# Patient Record
Sex: Male | Born: 1975 | Race: White | Hispanic: No | Marital: Married | Smoking: Never smoker
Health system: Southern US, Community
[De-identification: ages and names within clinical notes are randomized; demographics above are authoritative.]

## PROBLEM LIST (undated history)

## (undated) DIAGNOSIS — E7800 Pure hypercholesterolemia, unspecified: Secondary | ICD-10-CM

## (undated) HISTORY — PX: BACK SURGERY: SHX140

## (undated) HISTORY — PX: ANTERIOR CRUCIATE LIGAMENT REPAIR: SHX115

## (undated) HISTORY — DX: Pure hypercholesterolemia, unspecified: E78.00

---

## 2004-12-16 ENCOUNTER — Encounter: Admission: RE | Admit: 2004-12-16 | Discharge: 2004-12-16 | Payer: Self-pay | Admitting: Orthopedic Surgery

## 2006-03-26 ENCOUNTER — Other Ambulatory Visit: Admission: RE | Admit: 2006-03-26 | Discharge: 2006-03-26 | Payer: Self-pay | Admitting: Urology

## 2011-02-07 ENCOUNTER — Encounter: Payer: Self-pay | Admitting: *Deleted

## 2011-02-07 ENCOUNTER — Emergency Department (HOSPITAL_COMMUNITY): Admission: EM | Admit: 2011-02-07 | Payer: Self-pay | Source: Home / Self Care

## 2011-02-07 NOTE — ED Notes (Signed)
Pt in c/o SI, self inflicted lacerations to bilateral arms and neck, [employer] to bedside but pt is voluntary, pt states he wants help, bleeding controlled

## 2012-03-01 ENCOUNTER — Other Ambulatory Visit: Payer: Self-pay | Admitting: Sports Medicine

## 2012-03-01 DIAGNOSIS — M545 Low back pain: Secondary | ICD-10-CM

## 2012-03-04 ENCOUNTER — Ambulatory Visit
Admission: RE | Admit: 2012-03-04 | Discharge: 2012-03-04 | Disposition: A | Discharge status: Home / Self Care | Payer: Self-pay | Source: Ambulatory Visit | Attending: Sports Medicine | Admitting: Sports Medicine

## 2012-03-04 DIAGNOSIS — M545 Low back pain: Secondary | ICD-10-CM

## 2012-03-05 ENCOUNTER — Other Ambulatory Visit: Payer: Self-pay

## 2012-03-12 ENCOUNTER — Other Ambulatory Visit: Payer: Self-pay | Admitting: Neurosurgery

## 2012-03-12 DIAGNOSIS — M5126 Other intervertebral disc displacement, lumbar region: Secondary | ICD-10-CM

## 2012-03-16 ENCOUNTER — Other Ambulatory Visit: Payer: Self-pay

## 2012-03-18 ENCOUNTER — Ambulatory Visit
Admission: RE | Admit: 2012-03-18 | Discharge: 2012-03-18 | Disposition: A | Discharge status: Home / Self Care | Payer: BC Managed Care – PPO | Source: Ambulatory Visit | Attending: Neurosurgery | Admitting: Neurosurgery

## 2012-03-18 VITALS — BP 152/92 | HR 90

## 2012-03-18 DIAGNOSIS — M5126 Other intervertebral disc displacement, lumbar region: Secondary | ICD-10-CM

## 2012-03-18 MED ORDER — METHYLPREDNISOLONE ACETATE 40 MG/ML INJ SUSP (RADIOLOG
120.0000 mg | Freq: Once | INTRAMUSCULAR | Status: AC
  Administered 2012-03-18: 120 mg via EPIDURAL

## 2012-03-18 MED ORDER — IOHEXOL 180 MG/ML  SOLN
1.0000 mL | Freq: Once | INTRAMUSCULAR | Status: AC | PRN
  Administered 2012-03-18: 1 mL via EPIDURAL

## 2014-05-28 ENCOUNTER — Encounter (HOSPITAL_COMMUNITY): Payer: Self-pay | Admitting: Emergency Medicine

## 2014-05-28 ENCOUNTER — Emergency Department (HOSPITAL_COMMUNITY)
Admission: EM | Admit: 2014-05-28 | Discharge: 2014-05-28 | Disposition: A | Discharge status: Home / Self Care | Payer: BLUE CROSS/BLUE SHIELD | Attending: Emergency Medicine | Admitting: Emergency Medicine

## 2014-05-28 DIAGNOSIS — R519 Headache, unspecified: Secondary | ICD-10-CM

## 2014-05-28 DIAGNOSIS — R51 Headache: Secondary | ICD-10-CM | POA: Insufficient documentation

## 2014-05-28 NOTE — ED Provider Notes (Signed)
CSN: [id number]     Arrival date & time 05/28/14  1026 History  This chart was scribed for Chad HutchingBrian Jamaul Heist, MD by Ronney LionSuzanne Le, ED Scribe. This patient was seen in room APA05/APA05 and the patient's care was started at 11:12 AM.    Chief Complaint  Patient presents with  . Headache   The history is provided by the patient and the spouse. No language interpreter was used.    HPI Comments: Chad Stephenson is a 39 y.o. male who presents to the Emergency Department complaining of a constant, gradually worsening frontal and bitemporal headache for the past 2 weeks. His wife also notes he had been "seeing spots." He has a history of sinus headaches and initially thought this may be a sinus headache, but it persisted. He reports taking Mucinex a total of 3 times with no relief. He denies any recent URIs. He checked his blood pressure today, which was about 160/88. Patient denies a history of hypertension, stating that his blood pressure is usually around 130/70. Patient also mentions having a lot of stress related to his mother recently, which he suspects may be contributing to his symptoms. He denies a history of smoking. He denies any chronic medical conditions. He denies any ear pain, neck pain, or abdominal pain. Patient has been able to ambulate. 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  History reviewed. No pertinent past medical history. Past Surgical History  Procedure Laterality Date  . Back surgery    . Anterior cruciate ligament repair     History reviewed. No pertinent family history. History  Substance Use Topics  . Smoking status: Never Smoker   . Smokeless tobacco: Never Used  . Alcohol Use: No    Review of Systems  HENT: Negative for ear pain.   Gastrointestinal: Negative for abdominal pain.  Musculoskeletal: Negative for neck pain.  Neurological: Positive for headaches.  All other systems reviewed and are negative.   Allergies  Review of patient's allergies indicates  no known allergies.  Home Medications   Prior to Admission medications   Not on File   BP 144/90 mmHg  Pulse 73  Temp(Src) 98.2 F (36.8 C) (Oral)  Resp 16  Ht 6' (1.829 m)  Wt 184 lb (83.462 kg)  BMI 24.95 kg/m2  SpO2 96% Physical Exam  Constitutional: He is oriented to person, place, and time. He appears well-developed and well-nourished. No distress.  HENT:  Head: Normocephalic and atraumatic.  Eyes: Conjunctivae and EOM are normal.  Neck: Neck supple. No tracheal deviation present.  Cardiovascular: Normal rate.   Pulmonary/Chest: Effort normal. No respiratory distress.  Musculoskeletal: Normal range of motion.  Neurological: He is alert and oriented to person, place, and time.  Skin: Skin is warm and dry.  Psychiatric: He has a normal mood and affect. His behavior is normal.  Nursing note and vitals reviewed.   ED Course  Procedures (including critical care time)  DIAGNOSTIC STUDIES: Oxygen Saturation is 98% on room air, normal by my interpretation.    COORDINATION OF CARE: 11:21 AM - Do not see good reason to order CT scan at this time. Discussed treatment plan with pt at bedside which includes OTC pain relief, and pt agreed to plan. Also answered patient's questions and concerns about possible hypertension.   Labs Review Labs Reviewed - No data to display  Imaging Review No results found.   EKG Interpretation None      MDM   Final diagnoses:  Headache, unspecified  headache type   patient had normal physical exam. Blood pressure minimally elevated. Recommended initiating a blood pressure log. Rx Tylenol and/or ibuprofen for headache  I personally performed the services described in this documentation, which was scribed in my presence. The recorded information has been reviewed and is accurate.    Chad Hutching, MD 05/28/14 1151

## 2014-05-28 NOTE — Discharge Instructions (Signed)
Alternate Tylenol and ibuprofen for headache. Increase fluids. Recommend starting a blood pressure log and recording values. This can be reviewed by a health care professional

## 2014-05-28 NOTE — ED Notes (Signed)
Pt reports hypertension and headache x 2 weeks.

## 2015-11-28 ENCOUNTER — Emergency Department (HOSPITAL_COMMUNITY)
Admission: EM | Admit: 2015-11-28 | Discharge: 2015-11-28 | Disposition: A | Discharge status: Home / Self Care | Payer: Worker's Compensation | Attending: Emergency Medicine | Admitting: Emergency Medicine

## 2015-11-28 ENCOUNTER — Emergency Department (HOSPITAL_COMMUNITY): Payer: Worker's Compensation | Admitting: Certified Registered Nurse Anesthetist

## 2015-11-28 ENCOUNTER — Encounter (HOSPITAL_COMMUNITY)
Admission: EM | Disposition: A | Discharge status: Home / Self Care | Payer: Self-pay | Source: Home / Self Care | Attending: Emergency Medicine

## 2015-11-28 ENCOUNTER — Emergency Department (HOSPITAL_COMMUNITY): Payer: Worker's Compensation

## 2015-11-28 ENCOUNTER — Encounter (HOSPITAL_COMMUNITY): Payer: Self-pay | Admitting: Emergency Medicine

## 2015-11-28 DIAGNOSIS — S68120A Partial traumatic metacarpophalangeal amputation of right index finger, initial encounter: Secondary | ICD-10-CM | POA: Insufficient documentation

## 2015-11-28 DIAGNOSIS — W319XXA Contact with unspecified machinery, initial encounter: Secondary | ICD-10-CM | POA: Insufficient documentation

## 2015-11-28 DIAGNOSIS — S68628A Partial traumatic transphalangeal amputation of other finger, initial encounter: Secondary | ICD-10-CM | POA: Diagnosis present

## 2015-11-28 HISTORY — PX: AMPUTATION: SHX166

## 2015-11-28 LAB — CBC
HCT: 44.3 % (ref 39.0–52.0)
Hemoglobin: 15.5 g/dL (ref 13.0–17.0)
MCH: 30.9 pg (ref 26.0–34.0)
MCHC: 35 g/dL (ref 30.0–36.0)
MCV: 88.2 fL (ref 78.0–100.0)
Platelets: 252 10*3/uL (ref 150–400)
RBC: 5.02 MIL/uL (ref 4.22–5.81)
RDW: 12.5 % (ref 11.5–15.5)
WBC: 8.4 10*3/uL (ref 4.0–10.5)

## 2015-11-28 LAB — BASIC METABOLIC PANEL
Anion gap: 4 — ABNORMAL LOW (ref 5–15)
BUN: 10 mg/dL (ref 6–20)
CO2: 27 mmol/L (ref 22–32)
Calcium: 9.3 mg/dL (ref 8.9–10.3)
Chloride: 106 mmol/L (ref 101–111)
Creatinine, Ser: 1.09 mg/dL (ref 0.61–1.24)
GFR calc Af Amer: >60 mL/min (ref >60)
GFR calc non Af Amer: >60 mL/min (ref >60)
Glucose, Bld: 115 mg/dL — ABNORMAL HIGH (ref 65–99)
Potassium: 4.9 mmol/L (ref 3.5–5.1)
Sodium: 137 mmol/L (ref 135–145)

## 2015-11-28 SURGERY — AMPUTATION DIGIT
Anesthesia: General | Site: Finger | Laterality: Right

## 2015-11-28 MED ORDER — OXYCODONE-ACETAMINOPHEN 5-325 MG PO TABS: 1.0000 | ORAL_TABLET | Freq: Three times a day (TID) | ORAL | 0 refills | Status: AC

## 2015-11-28 MED ORDER — LACTATED RINGERS IV SOLN
INTRAVENOUS | Status: DC
Start: 2015-11-28 — End: 2015-11-28
  Administered 2015-11-28: 14:00:00 via INTRAVENOUS

## 2015-11-28 MED ORDER — BUPIVACAINE HCL (PF) 0.25 % IJ SOLN
INTRAMUSCULAR | Status: AC
  Filled 2015-11-28: qty 30

## 2015-11-28 MED ORDER — PROPOFOL 10 MG/ML IV BOLUS
INTRAVENOUS | Status: DC | PRN
  Administered 2015-11-28: 200 mg via INTRAVENOUS

## 2015-11-28 MED ORDER — BUPIVACAINE HCL (PF) 0.25 % IJ SOLN
INTRAMUSCULAR | Status: DC | PRN
  Administered 2015-11-28: 5 mL

## 2015-11-28 MED ORDER — CEPHALEXIN 500 MG PO CAPS: 500.0000 mg | ORAL_CAPSULE | Freq: Four times a day (QID) | ORAL | 0 refills | Status: DC

## 2015-11-28 MED ORDER — ONDANSETRON HCL 4 MG PO TABS: 4.0000 mg | ORAL_TABLET | Freq: Three times a day (TID) | ORAL | 0 refills | Status: DC | PRN

## 2015-11-28 MED ORDER — OXYCODONE HCL 5 MG/5ML PO SOLN: 5.0000 mg | Freq: Once | ORAL | Status: DC | PRN

## 2015-11-28 MED ORDER — ONDANSETRON HCL 4 MG/2ML IJ SOLN
INTRAMUSCULAR | Status: DC | PRN
  Administered 2015-11-28: 4 mg via INTRAVENOUS

## 2015-11-28 MED ORDER — OXYCODONE HCL 5 MG PO TABS: 5.0000 mg | ORAL_TABLET | Freq: Once | ORAL | Status: DC | PRN

## 2015-11-28 MED ORDER — SUCCINYLCHOLINE CHLORIDE 20 MG/ML IJ SOLN
INTRAMUSCULAR | Status: DC | PRN
  Administered 2015-11-28: 80 mg via INTRAVENOUS

## 2015-11-28 MED ORDER — DEXAMETHASONE SODIUM PHOSPHATE 10 MG/ML IJ SOLN
INTRAMUSCULAR | Status: DC | PRN
  Administered 2015-11-28: 10 mg via INTRAVENOUS

## 2015-11-28 MED ORDER — LIDOCAINE HCL (CARDIAC) 20 MG/ML IV SOLN
INTRAVENOUS | Status: AC
  Filled 2015-11-28: qty 5

## 2015-11-28 MED ORDER — ONDANSETRON HCL 4 MG/2ML IJ SOLN
INTRAMUSCULAR | Status: AC
  Filled 2015-11-28: qty 2

## 2015-11-28 MED ORDER — HYDROMORPHONE HCL 1 MG/ML IJ SOLN
1.0000 mg | Freq: Once | INTRAMUSCULAR | Status: AC
  Administered 2015-11-28: 1 mg via INTRAVENOUS
  Filled 2015-11-28: qty 1

## 2015-11-28 MED ORDER — ONDANSETRON HCL 4 MG/2ML IJ SOLN
4.0000 mg | Freq: Once | INTRAMUSCULAR | Status: AC
  Administered 2015-11-28: 4 mg via INTRAVENOUS
  Filled 2015-11-28: qty 2

## 2015-11-28 MED ORDER — MIDAZOLAM HCL 2 MG/2ML IJ SOLN
INTRAMUSCULAR | Status: AC
  Filled 2015-11-28: qty 2

## 2015-11-28 MED ORDER — EPHEDRINE 5 MG/ML INJ
INTRAVENOUS | Status: AC
  Filled 2015-11-28: qty 10

## 2015-11-28 MED ORDER — FENTANYL CITRATE (PF) 100 MCG/2ML IJ SOLN
INTRAMUSCULAR | Status: AC
  Filled 2015-11-28: qty 4

## 2015-11-28 MED ORDER — BUPIVACAINE HCL (PF) 0.5 % IJ SOLN
10.0000 mL | Freq: Once | INTRAMUSCULAR | Status: AC
  Administered 2015-11-28: 10 mL
  Filled 2015-11-28 (×2): qty 10

## 2015-11-28 MED ORDER — LIDOCAINE HCL (CARDIAC) 20 MG/ML IV SOLN
INTRAVENOUS | Status: DC | PRN
  Administered 2015-11-28: 100 mg via INTRAVENOUS

## 2015-11-28 MED ORDER — BUPIVACAINE HCL 0.25 % IJ SOLN: 30.0000 mL | Freq: Once | INTRAMUSCULAR | Status: DC

## 2015-11-28 MED ORDER — PROPOFOL 10 MG/ML IV BOLUS
INTRAVENOUS | Status: AC
  Filled 2015-11-28: qty 40

## 2015-11-28 MED ORDER — CEFAZOLIN SODIUM-DEXTROSE 2-4 GM/100ML-% IV SOLN
2.0000 g | Freq: Once | INTRAVENOUS | Status: AC
  Administered 2015-11-28: 2 g via INTRAVENOUS
  Filled 2015-11-28: qty 100

## 2015-11-28 MED ORDER — SUCCINYLCHOLINE CHLORIDE 200 MG/10ML IV SOSY
PREFILLED_SYRINGE | INTRAVENOUS | Status: AC
  Filled 2015-11-28: qty 10

## 2015-11-28 MED ORDER — 0.9 % SODIUM CHLORIDE (POUR BTL) OPTIME
TOPICAL | Status: DC | PRN
  Administered 2015-11-28: 1000 mL

## 2015-11-28 MED ORDER — MIDAZOLAM HCL 5 MG/5ML IJ SOLN
INTRAMUSCULAR | Status: DC | PRN
  Administered 2015-11-28 (×2): 1 mg via INTRAVENOUS

## 2015-11-28 MED ORDER — ONDANSETRON HCL 4 MG/2ML IJ SOLN: 4.0000 mg | Freq: Once | INTRAMUSCULAR | Status: DC | PRN

## 2015-11-28 MED ORDER — TETANUS-DIPHTHERIA TOXOIDS TD 5-2 LFU IM INJ
0.5000 mL | INJECTION | Freq: Once | INTRAMUSCULAR | Status: DC
Start: 2015-11-28 — End: 2015-11-28

## 2015-11-28 MED ORDER — DEXAMETHASONE SODIUM PHOSPHATE 10 MG/ML IJ SOLN
INTRAMUSCULAR | Status: AC
  Filled 2015-11-28: qty 1

## 2015-11-28 MED ORDER — DOCUSATE SODIUM 100 MG PO CAPS: 100.0000 mg | ORAL_CAPSULE | Freq: Two times a day (BID) | ORAL | 0 refills | Status: DC

## 2015-11-28 MED ORDER — FENTANYL CITRATE (PF) 100 MCG/2ML IJ SOLN: 25.0000 ug | INTRAMUSCULAR | Status: DC | PRN

## 2015-11-28 MED ORDER — LIDOCAINE 2% (20 MG/ML) 5 ML SYRINGE
INTRAMUSCULAR | Status: AC
  Filled 2015-11-28: qty 5

## 2015-11-28 MED ORDER — ROCURONIUM BROMIDE 10 MG/ML (PF) SYRINGE
PREFILLED_SYRINGE | INTRAVENOUS | Status: AC
  Filled 2015-11-28: qty 10

## 2015-11-28 MED ORDER — TETANUS-DIPHTH-ACELL PERTUSSIS 5-2.5-18.5 LF-MCG/0.5 IM SUSP
0.5000 mL | Freq: Once | INTRAMUSCULAR | Status: AC
  Administered 2015-11-28: 0.5 mL via INTRAMUSCULAR
  Filled 2015-11-28: qty 0.5

## 2015-11-28 SURGICAL SUPPLY — 54 items
BANDAGE ACE 4X5 VEL STRL LF (GAUZE/BANDAGES/DRESSINGS) IMPLANT
BANDAGE ELASTIC 3 VELCRO ST LF (GAUZE/BANDAGES/DRESSINGS) IMPLANT
BNDG CMPR 9X4 STRL LF SNTH (GAUZE/BANDAGES/DRESSINGS) ×2
BNDG COHESIVE 1X5 TAN STRL LF (GAUZE/BANDAGES/DRESSINGS) ×4 IMPLANT
BNDG CONFORM 2 STRL LF (GAUZE/BANDAGES/DRESSINGS) ×3 IMPLANT
BNDG ELASTIC 2X5.8 VLCR STR LF (GAUZE/BANDAGES/DRESSINGS) ×4 IMPLANT
BNDG ESMARK 4X9 LF (GAUZE/BANDAGES/DRESSINGS) ×3 IMPLANT
BNDG GAUZE ELAST 4 BULKY (GAUZE/BANDAGES/DRESSINGS) ×4 IMPLANT
BRUSH SCRUB EZ PLAIN DRY (MISCELLANEOUS) ×3 IMPLANT
CORDS BIPOLAR (ELECTRODE) ×4 IMPLANT
COVER SURGICAL LIGHT HANDLE (MISCELLANEOUS) ×4 IMPLANT
CUFF TOURNIQUET SINGLE 18IN (TOURNIQUET CUFF) ×4 IMPLANT
DRAPE SURG 17X23 STRL (DRAPES) ×4 IMPLANT
DRSG ADAPTIC 3X8 NADH LF (GAUZE/BANDAGES/DRESSINGS) ×4 IMPLANT
GAUZE SPONGE 2X2 8PLY STRL LF (GAUZE/BANDAGES/DRESSINGS) IMPLANT
GAUZE SPONGE 4X4 12PLY STRL (GAUZE/BANDAGES/DRESSINGS) IMPLANT
GLOVE BIOGEL PI IND STRL 7.5 (GLOVE) ×2 IMPLANT
GLOVE BIOGEL PI IND STRL 8.5 (GLOVE) ×2 IMPLANT
GLOVE BIOGEL PI INDICATOR 7.5 (GLOVE) ×4
GLOVE BIOGEL PI INDICATOR 8.5 (GLOVE) ×2
GLOVE BIOGEL PI ORTHO PRO 7.5 (GLOVE) ×2
GLOVE BIOGEL PI ORTHO PRO SZ8 (GLOVE) ×2
GLOVE PI ORTHO PRO STRL 7.5 (GLOVE) ×1 IMPLANT
GLOVE PI ORTHO PRO STRL SZ8 (GLOVE) ×1 IMPLANT
GLOVE SURG ORTHO 8.0 STRL STRW (GLOVE) ×4 IMPLANT
GOWN STRL REUS W/ TWL LRG LVL3 (GOWN DISPOSABLE) ×5 IMPLANT
GOWN STRL REUS W/ TWL XL LVL3 (GOWN DISPOSABLE) ×2 IMPLANT
GOWN STRL REUS W/TWL LRG LVL3 (GOWN DISPOSABLE) ×12
GOWN STRL REUS W/TWL XL LVL3 (GOWN DISPOSABLE) ×4
KIT BASIN OR (CUSTOM PROCEDURE TRAY) ×4 IMPLANT
KIT ROOM TURNOVER OR (KITS) ×4 IMPLANT
MANIFOLD NEPTUNE II (INSTRUMENTS) ×4 IMPLANT
NDL HYPO 25GX1X1/2 BEV (NEEDLE) IMPLANT
NEEDLE HYPO 25GX1X1/2 BEV (NEEDLE) ×4 IMPLANT
NS IRRIG 1000ML POUR BTL (IV SOLUTION) ×4 IMPLANT
PACK ORTHO EXTREMITY (CUSTOM PROCEDURE TRAY) ×4 IMPLANT
PAD ARMBOARD 7.5X6 YLW CONV (MISCELLANEOUS) ×8 IMPLANT
PAD CAST 4YDX4 CTTN HI CHSV (CAST SUPPLIES) IMPLANT
PADDING CAST COTTON 4X4 STRL (CAST SUPPLIES)
SCRUB POVIDONE IODINE 4 OZ (MISCELLANEOUS) ×3 IMPLANT
SOAP 2 % CHG 4 OZ (WOUND CARE) ×4 IMPLANT
SOAP 2% CHG 32OZ (WOUND CARE) ×3 IMPLANT
SPECIMEN JAR SMALL (MISCELLANEOUS) ×4 IMPLANT
SPONGE GAUZE 2X2 STER 10/PKG (GAUZE/BANDAGES/DRESSINGS)
SUCTION FRAZIER HANDLE 10FR (MISCELLANEOUS) ×2
SUCTION TUBE FRAZIER 10FR DISP (MISCELLANEOUS) ×1 IMPLANT
SUT MERSILENE 4 0 P 3 (SUTURE) ×3 IMPLANT
SUT PROLENE 4 0 P 3 18 (SUTURE) ×6 IMPLANT
SUT PROLENE 4 0 PS 2 18 (SUTURE) ×3 IMPLANT
SYR CONTROL 10ML LL (SYRINGE) ×3 IMPLANT
TOWEL OR 17X24 6PK STRL BLUE (TOWEL DISPOSABLE) ×4 IMPLANT
TUBE CONNECTING 12'X1/4 (SUCTIONS) ×1
TUBE CONNECTING 12X1/4 (SUCTIONS) ×2 IMPLANT
WATER STERILE IRR 1000ML POUR (IV SOLUTION) ×4 IMPLANT

## 2015-11-28 NOTE — Transfer of Care (Signed)
Immediate Anesthesia Transfer of Care Note  Patient: Chad Stephenson  Procedure(s) Performed: Procedure(s): REVISION MINOR AMPUTATION OF DIGIT RIGHT INDEX FINGER (Right)  Patient Location: PACU  Anesthesia Type:General  Level of Consciousness: awake, alert  and oriented  Airway & Oxygen Therapy: Patient Spontanous Breathing and Patient connected to nasal cannula oxygen  Post-op Assessment: Report given to RN and Post -op Vital signs reviewed and stable  Post vital signs: Reviewed and stable  Last Vitals:  Vitals:   11/28/15 1230 11/28/15 1300  BP: 147/82 136/85  Pulse: 98 93  Resp:    Temp:      Last Pain:  Vitals:   11/28/15 1321  TempSrc:   PainSc: 0-No pain         Complications: No apparent anesthesia complications

## 2015-11-28 NOTE — ED Triage Notes (Signed)
Rt index finger injury caught between 2 rollers  Partial amputation 2 nd joint has 18 left ac 10 of morphine per [facility]

## 2015-11-28 NOTE — Progress Notes (Signed)
Pt c/o nausea Dr Gentry RochJudd in to see pt and informed new orders noted.

## 2015-11-28 NOTE — Anesthesia Preprocedure Evaluation (Signed)
Anesthesia Evaluation  Patient identified by MRN, date of birth, ID band Patient awake    Reviewed: Allergy & Precautions, H&P , Patient's Chart, lab work & pertinent test resultsPreop documentation limited or incomplete due to emergent nature of procedure.  History of Anesthesia Complications Negative for: history of anesthetic complications  Airway Mallampati: II  TM Distance: >3 FB Neck ROM: full    Dental no notable dental hx.    Pulmonary neg pulmonary ROS,    Pulmonary exam normal breath sounds clear to auscultation       Cardiovascular negative cardio ROS Normal cardiovascular exam Rhythm:regular Rate:Normal     Neuro/Psych negative neurological ROS     GI/Hepatic negative GI ROS, Neg liver ROS,   Endo/Other  negative endocrine ROS  Renal/GU negative Renal ROS     Musculoskeletal   Abdominal   Peds  Hematology negative hematology ROS (+)   Anesthesia Other Findings   Reproductive/Obstetrics negative OB ROS                             Anesthesia Physical Anesthesia Plan  ASA: II and emergent  Anesthesia Plan: General   Post-op Pain Management:    Induction: Intravenous  Airway Management Planned: Oral ETT  Additional Equipment: None  Intra-op Plan:   Post-operative Plan: Extubation in OR  Informed Consent: I have reviewed the patients History and Physical, chart, labs and discussed the procedure including the risks, benefits and alternatives for the proposed anesthesia with the patient or authorized representative who has indicated his/her understanding and acceptance.   Dental Advisory Given  Plan Discussed with: Anesthesiologist, CRNA and Surgeon  Anesthesia Plan Comments: (Traumatic finger amputation)        Anesthesia Quick Evaluation

## 2015-11-28 NOTE — Progress Notes (Signed)
PT SEEN/EXAMINED PT WITH RIGHT INDEX FINGER AVULSION AMPUTATION PT HERE FOR SURGERY NO PRIOR SURGERY TO RIGHT INDEX FINGER PT UNDERSTANDS AVULSION INJUURY NOT REPLANTABLE WILL PROCEED WITH REVISION AMPUTATION  PT MAY NEED RAY AMPUTATION IN FUTURE, PT WANTS TO PROCEED WITH REVISION AMPUTATION FIRST R/B/A DISCUSSED WITH PT IN OFFICE.  PT VOICED UNDERSTANDING OF PLAN CONSENT SIGNED DAY OF SURGERY PT SEEN AND EXAMINED PRIOR TO OPERATIVE PROCEDURE/DAY OF SURGERY SITE MARKED. QUESTIONS ANSWERED WILL GO HOME FOLLOWING SURGERY WE ARE PLANNING SURGERY FOR YOUR UPPER EXTREMITY. THE RISKS AND BENEFITS OF SURGERY INCLUDE BUT NOT LIMITED TO BLEEDING INFECTION, DAMAGE TO NEARBY NERVES ARTERIES TENDONS, FAILURE OF SURGERY TO ACCOMPLISH ITS INTENDED GOALS, PERSISTENT SYMPTOMS AND NEED FOR FURTHER SURGICAL INTERVENTION. WITH THIS IN MIND WE WILL PROCEED. I HAVE DISCUSSED WITH THE PATIENT THE PRE AND POSTOPERATIVE REGIMEN AND THE DOS AND DON'TS. PT VOICED UNDERSTANDING AND INFORMED CONSENT SIGNED.

## 2015-11-28 NOTE — Discharge Instructions (Signed)
KEEP BANDAGE CLEAN AND DRY °CALL OFFICE FOR F/U APPT [phone] IN 10 DAYS °DR Sophina Mitten CELL [phone] °KEEP HAND ELEVATED ABOVE HEART °OK TO APPLY ICE TO OPERATIVE AREA °CONTACT OFFICE IF ANY WORSENING PAIN OR CONCERNS. °

## 2015-11-28 NOTE — Anesthesia Procedure Notes (Signed)
Procedure Name: Intubation Performed by: Karlyne GreenspanJUDD, BENJAMIN Pre-anesthesia Checklist: Patient identified, Emergency Drugs available, Suction available, Patient being monitored and Timeout performed Patient Re-evaluated:Patient Re-evaluated prior to inductionOxygen Delivery Method: Circle system utilized Preoxygenation: Pre-oxygenation with 100% oxygen Intubation Type: IV induction and Rapid sequence Laryngoscope Size: Mac and 4 Grade View: Grade IV Tube type: Oral Tube size: 7.5 mm Number of attempts: 2 Airway Equipment and Method: Bougie stylet Placement Confirmation: ETT inserted through vocal cords under direct vision,  positive ETCO2 and breath sounds checked- equal and bilateral Secured at: 23 cm Tube secured with: Tape Dental Injury: Teeth and Oropharynx as per pre-operative assessment  Difficulty Due To: Difficult Airway- due to anterior larynx Comments: DLx 1 A. Yacoub CRNA epiglottis visualized attempt to pass bougie stylet unsuccessful. DL x 2 B. Judd MD bougie stylet utilized oral ETT placed over bougie stylet, +EtCO2 BBSE

## 2015-11-28 NOTE — ED Provider Notes (Signed)
[facility]-EMERGENCY DEPT Provider Note   CSN: [id number] Arrival date & time: 11/28/15  1142     History   Chief Complaint Chief Complaint  Patient presents with  . Hand Injury    HPI Chad Stephenson is a 40 y.o. male.  HPI Patient presents emergency department for complaints of injury to his right index finger.  He is ambidextrous but does write with his right hand.  His right finger became intertwined in work machinery today and he attempted to withdraw's hand resulting in a severe degloving injury of his right index finger.  He presents to the emergency department via EMS.  His pain is moderate to severe in severity at this time.  He has no sensation to the distal portion of the finger which is nearly completely amputated   History reviewed. No pertinent past medical history.  There are no active problems to display for this patient.   Past Surgical History:  Procedure Laterality Date  . ANTERIOR CRUCIATE LIGAMENT REPAIR    . BACK SURGERY         Home Medications    Prior to Admission medications   Medication Sig Start Date End Date Taking? Authorizing Provider  Multiple Vitamins-Minerals (MULTIVITAMIN WITH MINERALS) tablet Take 1 tablet by mouth daily.   Yes Historical Provider, MD  docusate sodium (COLACE) 100 MG capsule Take 1 capsule (100 mg total) by mouth 2 (two) times daily. 11/28/15   Bradly Bienenstock, MD  ondansetron (ZOFRAN) 4 MG tablet Take 1 tablet (4 mg total) by mouth every 8 (eight) hours as needed for nausea or vomiting. 11/28/15   Bradly Bienenstock, MD  oxyCODONE-acetaminophen (ROXICET) 5-325 MG tablet Take 1 tablet by mouth 3 (three) times daily. 11/28/15 12/08/15  Bradly Bienenstock, MD    Family History No family history on file.  Social History Social History  Substance Use Topics  . Smoking status: Never Smoker  . Smokeless tobacco: Never Used  . Alcohol use No     Allergies   Review of patient's allergies indicates no known allergies.   Review of  Systems Review of Systems  All other systems reviewed and are negative.    Physical Exam Updated Vital Signs BP 136/85   Pulse 93   Temp 98.1 F (36.7 C) (Oral)   Resp 18   Ht 6' (1.829 m)   Wt 173 lb (78.5 kg)   SpO2 100%   BMI 23.46 kg/m   Physical Exam  Constitutional: He is oriented to person, place, and time. He appears well-developed and well-nourished.  HENT:  Head: Normocephalic.  Eyes: EOM are normal.  Neck: Normal range of motion.  Pulmonary/Chest: Effort normal.  Abdominal: He exhibits no distension.  Musculoskeletal: Normal range of motion.  Degloving injury of the right index finger at the level of the PIP joint.  Exposed bone distally.  Distal finger is attached only at the flexor tendon and it appears that both neurovascular bundles are out.  Patient with no perfusion into the amputated fingertip and is insensate in this area  Neurological: He is alert and oriented to person, place, and time.  Psychiatric: He has a normal mood and affect.  Nursing note and vitals reviewed.    ED Treatments / Results  Labs (all labs ordered are listed, but only abnormal results are displayed) Labs Reviewed  BASIC METABOLIC PANEL - Abnormal; Notable for the following:       Result Value   Glucose, Bld 115 (*)    Anion gap 4 (*)  All other components within normal limits  CBC    EKG  EKG Interpretation None       Radiology Dg Hand Complete Right  Result Date: 11/28/2015 CLINICAL DATA:  Work injury RIGHT index finger, degloving injury EXAM: RIGHT HAND - COMPLETE 3+ VIEW COMPARISON:  Non FINDINGS: Dressing artifacts at index finger. Osseous mineralization normal. Soft tissue swelling RIGHT index finger. Joint spaces preserved. Volar PIP dislocation RIGHT index finger. No fracture, dislocation or bone destruction. IMPRESSION: Volar PIP dislocation RIGHT index finger. Electronically Signed   By: Ulyses SouthwardMark  Boles M.D.   On: 11/28/2015 13:19     ++++++++++++++++++++++++++++++++++  Procedures .Nerve Block Date/Time: 11/28/2015 3:21 PM Performed by: Azalia BilisAMPOS, Skarlet Lyons Authorized by: Azalia BilisAMPOS, Elisama Thissen    Consent: Verbal consent obtained. Required items: required blood products, implants, devices, and special equipment available Time out: Immediately prior to procedure a "time out" was called to verify the correct patient, procedure, equipment, support staff and site/side marked as required. Indication: pain control/amputation Nerve block body site: digital nerves right index finger Preparation: Patient was prepped and draped in the usual sterile fashion. Needle gauge: 24 G Location technique: anatomical landmarks Local anesthetic: marcaine 0.5% Anesthetic total: 4 ml Outcome: pain improved Patient tolerance: Patient tolerated the procedure well with no immediate complications.  +++++++++++++++++++++++++++++++++++++++     Medications Ordered in ED Medications  lactated ringers infusion ( Intravenous Anesthesia Volume Adjustment 11/28/15 1506)  ondansetron (ZOFRAN) 4 MG/2ML injection (not administered)  bupivacaine (MARCAINE) 0.5 % injection 10 mL (10 mLs Infiltration Given by Other 11/28/15 1213)  lidocaine (cardiac) 100 mg/615ml (XYLOCAINE) 20 MG/ML injection 2% (  Override pull for Anesthesia 11/28/15 1423)  HYDROmorphone (DILAUDID) injection 1 mg (1 mg Intravenous Given 11/28/15 1214)  ceFAZolin (ANCEF) IVPB 2g/100 mL premix (2 g Intravenous New Bag/Given 11/28/15 1213)  Tdap (BOOSTRIX) injection 0.5 mL (0.5 mLs Intramuscular Given 11/28/15 1213)  ondansetron (ZOFRAN) injection 4 mg (4 mg Intravenous Given 11/28/15 1348)     Initial Impression / Assessment and Plan / ED Course  I have reviewed the triage vital signs and the nursing notes.  Pertinent labs & imaging results that were available during my care of the patient were reviewed by me and considered in my medical decision making (see chart for details).  Clinical Course     Degloving injury of the right index finger.  Patient will need amputation revision.  Case discussed with Dr. Melvyn Novasrtmann who will take the patient to the operating room.  Antibiotics.  Nothing by mouth now.  Pain treated.  Nerve block for pain control  Final Clinical Impressions(s) / ED Diagnoses   Final diagnoses:  Partial traumatic transphalangeal amputation of finger, init    New Prescriptions Current Discharge Medication List    START taking these medications   Details  docusate sodium (COLACE) 100 MG capsule Take 1 capsule (100 mg total) by mouth 2 (two) times daily. Qty: 10 capsule, Refills: 0    ondansetron (ZOFRAN) 4 MG tablet Take 1 tablet (4 mg total) by mouth every 8 (eight) hours as needed for nausea or vomiting. Qty: 20 tablet, Refills: 0    oxyCODONE-acetaminophen (ROXICET) 5-325 MG tablet Take 1 tablet by mouth 3 (three) times daily. Qty: 30 tablet, Refills: 0         Azalia BilisKevin Katheleen Stella, MD 11/28/15 [phone]

## 2015-11-28 NOTE — Anesthesia Postprocedure Evaluation (Signed)
Anesthesia Post Note  Patient: Chad SchullerBryan M Stephenson  Procedure(s) Performed: Procedure(s) (LRB): REVISION MINOR AMPUTATION OF DIGIT RIGHT INDEX FINGER (Right)  Patient location during evaluation: PACU Anesthesia Type: General Level of consciousness: awake and alert Pain management: pain level controlled Vital Signs Assessment: post-procedure vital signs reviewed and stable Respiratory status: spontaneous breathing, nonlabored ventilation, respiratory function stable and patient connected to nasal cannula oxygen Cardiovascular status: blood pressure returned to baseline and stable Postop Assessment: no signs of nausea or vomiting Anesthetic complications: no    Last Vitals:  Vitals:   11/28/15 1508 11/28/15 1539  BP:  (!) 104/57  Pulse:  (!) 104  Resp:  16  Temp: 36.3 C     Last Pain:  Vitals:   11/28/15 1530  TempSrc:   PainSc: 0-No pain                 Reino KentJudd, Rieley Khalsa J

## 2015-11-28 NOTE — H&P (Signed)
Chad Stephenson is an 40 y.o. male.    Chief Complaint: right index finger injury  HPI: 40 y/o male injured right index finger earlier today. Pt working and had right index finger pressed between two rollers, pulled his hand back and essentially degloved the distal right index finger. C/o mild soreness to right PIP joint. Pictures reviewed of the injury showing complete degloving from PIP distally of right index finger. Pt is ambidextrous. Denies any other injuries or issues. Denies any previous issues with anesthesia.  PCP:  No primary care provider on file.  PMH: History reviewed. No pertinent past medical history.  PSH: Past Surgical History:  Procedure Laterality Date  . ANTERIOR CRUCIATE LIGAMENT REPAIR    . BACK SURGERY      Social History:  reports that he has never smoked. He has never used smokeless tobacco. He reports that he does not drink alcohol or use drugs.  Allergies:  No Known Allergies  Medications: Current Facility-Administered Medications  Medication Dose Route Frequency Provider Last Rate Last Dose  . lidocaine (cardiac) 100 mg/66m (XYLOCAINE) 20 MG/ML injection 2%            Current Outpatient Prescriptions  Medication Sig Dispense Refill  . Multiple Vitamins-Minerals (MULTIVITAMIN WITH MINERALS) tablet Take 1 tablet by mouth daily.      Results for orders placed or performed during the hospital encounter of 11/28/15 (from the past 48 hour(s))  CBC     Status: None   Collection Time: 11/28/15 12:15 PM  Result Value Ref Range   WBC 8.4 4.0 - 10.5 K/uL   RBC 5.02 4.22 - 5.81 MIL/uL   Hemoglobin 15.5 13.0 - 17.0 g/dL   HCT 44.3 39.0 - 52.0 %   MCV 88.2 78.0 - 100.0 fL   MCH 30.9 26.0 - 34.0 pg   MCHC 35.0 30.0 - 36.0 g/dL   RDW 12.5 11.5 - 15.5 %   Platelets 252 150 - 400 K/uL  Basic metabolic panel     Status: Abnormal   Collection Time: 11/28/15 12:15 PM  Result Value Ref Range   Sodium 137 135 - 145 mmol/L   Potassium 4.9 3.5 - 5.1 mmol/L   Chloride 106 101 - 111 mmol/L   CO2 27 22 - 32 mmol/L   Glucose, Bld 115 (H) 65 - 99 mg/dL   BUN 10 6 - 20 mg/dL   Creatinine, Ser 1.09 0.61 - 1.24 mg/dL   Calcium 9.3 8.9 - 10.3 mg/dL   GFR calc non Af Amer >60 >60 mL/min   GFR calc Af Amer >60 >60 mL/min    Comment: (NOTE) The eGFR has been calculated using the CKD EPI equation. This calculation has not been validated in all clinical situations. eGFR's persistently <60 mL/min signify possible Chronic Kidney Disease.    Anion gap 4 (L) 5 - 15   Dg Hand Complete Right  Result Date: 11/28/2015 CLINICAL DATA:  Work injury RIGHT index finger, degloving injury EXAM: RIGHT HAND - COMPLETE 3+ VIEW COMPARISON:  Non FINDINGS: Dressing artifacts at index finger. Osseous mineralization normal. Soft tissue swelling RIGHT index finger. Joint spaces preserved. Volar PIP dislocation RIGHT index finger. No fracture, dislocation or bone destruction. IMPRESSION: Volar PIP dislocation RIGHT index finger. Electronically Signed   By: MLavonia DanaM.D.   On: 11/28/2015 13:19    ROS: ROS Pain in right hand s/p injury Otherwise ROS negaitive  Physical Exam: Alert and oriented 40y/o male in no acute distress Right hand: right  index finger with avulsion injury distal of the PIP joint with exposed bone and no sensation or movement distally Mild tenderness to PIP joint of right 3rd finger but full rom No other signs of injury to the hand Breath sounds equal bilaterally Pulses intact to medial and lateral wrist/hand Physical Exam   Assessment/Plan Assessment: avulsion injury right index finger with exposed bone  Plan: Discussed case with Dr. Caralyn Guile and plan for urgent I&D and revision amputation to right index finger. Discussed surgery and recovery with pt and family. Keep NPO Had digital block in the emergency department and currently in minimal to no pain

## 2015-11-29 NOTE — Op Note (Signed)
NAME:  Chad Stephenson, Chad Stephenson                   ACCOUNT NO.:  [id number]  MEDICAL RECORD NO.:  [id number]  LOCATION:  [facility]                         FACILITY:  [facility]  PHYSICIAN:  Sharma Covert IV, M.D.DATE OF BIRTH:  1975-04-02  DATE OF PROCEDURE:  11/28/2015 DATE OF DISCHARGE:  11/28/2015                              OPERATIVE REPORT   PREOPERATIVE DIAGNOSES:  Right index finger avulsion amputation, ring avulsion-type injury to the index finger with near-complete amputation through the level of the middle phalanx.  POSTOPERATIVE DIAGNOSES:  Right index finger avulsion amputation, ring avulsion-type injury to the index finger with near-complete amputation through the level of the middle phalanx.  ATTENDING PHYSICIAN:  Sharma Covert, M.D., who scrubbed and present for the entire procedure.  ASSISTANT SURGEON:  Chad Stephenson, P.A., who scrubbed and present for the entire procedure, helped to assist getting the patient into the operating room, the intraoperative procedure, closure, and application of the dressings.  SURGICAL PROCEDURES: 1. Irrigation and debridement of skin and subcutaneous tissue and bone     associated with open phalangeal injury. 2. Revision amputation, left index finger with advancement flap     closure and local neurectomies.  SURGICAL INDICATIONS:  Chad Stephenson is a right-hand-dominant gentleman, who unfortunately got his right hand caught in between 2 rollers.  The patient had the avulsion-type injury to the index finger and presented to the ER with the open and exposed middle phalanx.  The patient had a near-complete avulsion injury to the index finger with a positive ribbon sign, the attenuation of the neurovascular bundles in this.  Based on the injury and dysvascular distal tip, he was not felt to be a candidate for re-implant given the significant vascular and tendinous injury.  The patient was counseled and recommended to undergo the revision amputation.  We  talked about ring amputation.  We elected to proceed with a revision amputation.  The patient understands that the patient may need to be taken back for revision amputation through the level of the metacarpal ray if he does not like the length of the length of the index finger at the level of the proximal phalanx.  Risks, benefits, and alternatives were discussed in detail with the patient.  Signed informed consent was obtained.  Risks include, but not limited to bleeding, infection, damage to nearby nerves, arteries, or tendons; loss of motion of the wrist and digits, incomplete relief of symptoms, and need for further surgical intervention.  DESCRIPTION OF PROCEDURE:  The patient was properly identified in the preoperative holding area and marked with a permanent marker made on the right index finger to indicate the correct operative site.  The patient was brought back to the operating room and placed supine on the anesthesia room table.  General anesthesia was administered.  The patient then received preoperative antibiotics prior to skin incision. A well-padded tourniquet was then placed on the right brachium and sealed with 1000 drape.  Right upper extremity was then prepped and draped in a normal sterile fashion.  Time-out was called, correct side was identified, and procedure was then begun.  Attention was then turned to the right index finger.  Excisional debridement of the skin and subcutaneous tissue was then carried out of the dysvascular tissue.  The patient did have a large attenuation in the FDP.  The FDP was then brought distally and then resected and allowed to retract proximally. Neurovascular bundles were completely stretched greater than several centimeters elongation of the neurovascular bundles.  Neurectomies were then carried out of the radial neurovascular bundles, and then, amputation was then carried out with a skin bridge through the level of the proximal  phalanx.  This was carried out with a small bone cutter and then rongeur to smooth down the surface.  The FDS was allowed to retract.  The wound was then thoroughly irrigated.  After copious wound irrigation and excisional debridement, local flaps were then advanced dorsally to volarly in a V-Y fashion closing the defect with Prolene sutures.  Adaptic dressing and sterile compressive bandage were then applied.  The patient tolerated the procedure well, returned to the recovery room in good condition.  POSTPROCEDURE PLAN:  The patient discharged to home.  Seen back in my office in approximately 7-10 days for wound check, x-rays, small protective dressing, and then, we will increase his activity as the wound heals.  Again, we did talk about the potential further intervention in terms of ray amputation and we will see how he does in terms of this revision amputation before considering resection at the level of the metacarpal base.     Madelynn DoneFred W. Tricia Oaxaca IV, M.D.     FWO/MEDQ  D:  11/28/2015  T:  11/29/2015  Job:  [id number]

## 2015-11-30 ENCOUNTER — Encounter (HOSPITAL_COMMUNITY): Payer: Self-pay | Admitting: Orthopedic Surgery

## 2017-11-10 ENCOUNTER — Ambulatory Visit: Payer: BLUE CROSS/BLUE SHIELD

## 2017-11-10 ENCOUNTER — Encounter: Payer: Self-pay | Admitting: Podiatry

## 2017-11-10 ENCOUNTER — Ambulatory Visit: Payer: BLUE CROSS/BLUE SHIELD | Admitting: Podiatry

## 2017-11-10 VITALS — BP 141/93 | HR 76 | Resp 16

## 2017-11-10 DIAGNOSIS — B070 Plantar wart: Secondary | ICD-10-CM

## 2017-11-10 DIAGNOSIS — M779 Enthesopathy, unspecified: Principal | ICD-10-CM

## 2017-11-10 DIAGNOSIS — M778 Other enthesopathies, not elsewhere classified: Secondary | ICD-10-CM

## 2017-11-10 NOTE — Patient Instructions (Signed)

## 2017-11-11 ENCOUNTER — Ambulatory Visit: Payer: BLUE CROSS/BLUE SHIELD | Admitting: Podiatry

## 2017-11-11 NOTE — Progress Notes (Signed)
  Subjective:  Patient ID: Chad Stephenson, male    DOB: 03-Jul-1975,  MRN: [id number] HPI Chief Complaint  Patient presents with  . Foot Pain    Sub 5th MPJ left - tender, callused lesion x several months, tried multiple wart meds  . New Patient (Initial Visit)    42 y.o. male presents with the above complaint.   ROS: Denies fever chills nausea vomiting muscle aches pains calf pain back pain chest pain shortness of breath.  No past medical history on file. Past Surgical History:  Procedure Laterality Date  . AMPUTATION Right 11/28/2015   Procedure: REVISION MINOR AMPUTATION OF DIGIT RIGHT INDEX FINGER;  Surgeon: Bradly BienenstockFred Ortmann, MD;  Location: [facility];  Service: Orthopedics;  Laterality: Right;  . ANTERIOR CRUCIATE LIGAMENT REPAIR    . BACK SURGERY     No current outpatient medications on file.  No Known Allergies Review of Systems Objective:   Vitals:   11/10/17 1558  BP: (!) 141/93  Pulse: 76  Resp: 16    General: Well developed, nourished, in no acute distress, alert and oriented x3   Dermatological: Skin is warm, dry and supple bilateral. Nails x 10 are well maintained; remaining integument appears unremarkable at this time. There are no open sores, no preulcerative lesions, no rash or signs of infection present.  Verrucoid lesion sub-fifth metatarsal phalangeal joint of the left foot measuring approximately 0.7 cm in diameter.  Thrombosed capillaries are visible skin lines circumvent the lesion.  Right foot demonstrates a solitary porokeratotic lesion beneath the fifth metatarsal head no signs of wart.  Vascular: Dorsalis Pedis artery and Posterior Tibial artery pedal pulses are 2/4 bilateral with immedate capillary fill time. Pedal hair growth present. No varicosities and no lower extremity edema present bilateral.   Neruologic: Grossly intact via light touch bilateral. Vibratory intact via tuning fork bilateral. Protective threshold with Semmes Wienstein monofilament intact to  all pedal sites bilateral. Patellar and Achilles deep tendon reflexes 2+ bilateral. No Babinski or clonus noted bilateral.   Musculoskeletal: No gross boney pedal deformities bilateral. No pain, crepitus, or limitation noted with foot and ankle range of motion bilateral. Muscular strength 5/5 in all groups tested bilateral.  Gait: Unassisted, Nonantalgic.    Radiographs:  None taken  Assessment & Plan:   Assessment: Verruca plantaris sub-fifth metatarsal phalangeal joint of the left foot porokeratosis sub-fifth metatarsal phalangeal joint right foot  Plan: Local anesthetic was administered beneath the fifth metatarsal phalangeal joint lesion.  This was a total of 2 cc of a 50-50 mixture of Marcaine plain and lidocaine with epinephrine.  He tolerated procedure well without complications.  He was given both oral and written home-going instruction for care and soaking of his foot and I will follow-up with him in a couple of weeks.  We did send a specimen for pathology.     Saadia Dewitt T. Davis JunctionHyatt, [place] Dakota[place]PM

## 2017-11-19 ENCOUNTER — Ambulatory Visit: Payer: BLUE CROSS/BLUE SHIELD | Admitting: Podiatry

## 2017-11-24 ENCOUNTER — Ambulatory Visit (INDEPENDENT_AMBULATORY_CARE_PROVIDER_SITE_OTHER): Payer: BLUE CROSS/BLUE SHIELD

## 2017-11-24 ENCOUNTER — Telehealth: Payer: Self-pay | Admitting: *Deleted

## 2017-11-24 DIAGNOSIS — B070 Plantar wart: Secondary | ICD-10-CM

## 2017-11-24 NOTE — Telephone Encounter (Signed)
Left message on pt's home phone to call for results. Left message on pt's work phone to call for results.

## 2017-11-24 NOTE — Telephone Encounter (Signed)
-----   Message from Elinor Parkinson, [place] sent at 11/24/2017  7:08 AM EDT ----- Chad Stephenson.

## 2017-11-24 NOTE — Patient Instructions (Signed)

## 2017-11-24 NOTE — Telephone Encounter (Signed)
My husband just left from being seen. He is to keep his foot wrapped for another week so I will be doing that. He wanted to know if he can get into a salt water swimming pool? You can call me back at [phone] and if you can't speak to me, you can call him back at [phone].

## 2017-11-24 NOTE — Telephone Encounter (Signed)
I told Chad Stephenson that the area needed to stay covered as directed and not to let pt swim in the pool. Chad Stephenson states doctor said he didn't have to see him again once the area had a scab. I told Chad Stephenson that once there was no scab and no broken skin the pt could swim.

## 2017-11-25 NOTE — Progress Notes (Signed)
Patient is here today for follow-up removal of verruca wart sub-fifth left foot, procedure performed 11/10/2017.  At this time he is having no pain and says that overall his foot feels much better.  Noted well-healing surgical site.  No redness, no erythema, no drainage, no other signs and symptoms of infection.  Area is scabbing over and is nontender on palpation.  Discussed signs and symptoms of infection, verbal and written instructions were dispensed.  He is to follow-up with any acute symptom changes.

## 2018-01-01 ENCOUNTER — Ambulatory Visit: Payer: BLUE CROSS/BLUE SHIELD | Admitting: Podiatry

## 2018-01-01 ENCOUNTER — Encounter

## 2018-01-01 DIAGNOSIS — L989 Disorder of the skin and subcutaneous tissue, unspecified: Secondary | ICD-10-CM | POA: Diagnosis not present

## 2018-01-01 DIAGNOSIS — B070 Plantar wart: Secondary | ICD-10-CM | POA: Diagnosis not present

## 2018-01-01 DIAGNOSIS — M79672 Pain in left foot: Secondary | ICD-10-CM

## 2018-01-01 MED ORDER — IMIQUIMOD 5 % EX CREA: TOPICAL_CREAM | CUTANEOUS | 0 refills | Status: DC

## 2018-01-04 ENCOUNTER — Telehealth: Payer: Self-pay | Admitting: Podiatry

## 2018-01-04 NOTE — Telephone Encounter (Signed)
Left message informing pt I had spoken to pharmacy staff and a PA was required for the medication and I would give it to the Pre-cert coordinator.

## 2018-01-04 NOTE — Telephone Encounter (Signed)
[facility]ist states pt's medication needs a PA.

## 2018-01-04 NOTE — Telephone Encounter (Signed)
[employer] company will not fill RX, until they know why you chose this particular, medication over a Cheaper one. The Pharmacy needs you to give them a call. [facility] [address] [facility] in [place].

## 2018-01-08 ENCOUNTER — Telehealth: Payer: Self-pay | Admitting: Podiatry

## 2018-01-08 NOTE — Telephone Encounter (Signed)
Pt called and stated that [facility] sent over a pre-authorization letter on Monday evening for the patients medication. [employer] stated she left a vm last night to make sure that the authorization letter was received. Could you please call the patient.

## 2018-01-08 NOTE — Telephone Encounter (Signed)
I informed pt the PA form had been received from [facility] and the pre-cert had been started.

## 2018-01-14 NOTE — Progress Notes (Signed)
  Subjective:  Patient ID: Chad Stephenson, male    DOB: 1976-02-20,  MRN: [id number]  Chief Complaint  Patient presents with  . Plantar Warts    left foot    42 y.o. male presents with the above complaint. Reports warts to the left foot treated by Dr. Al Corpus.  States another there are several   Review of Systems: Negative except as noted in the HPI. Denies N/V/F/Ch.  No past medical history on file.  Current Outpatient Medications:  .  imiquimod (ALDARA) 5 % cream, Apply topically 2 (two) times a week., Disp: 12 each, Rfl: 0  Social History   Tobacco Use  Smoking Status Never Smoker  Smokeless Tobacco Never Used    No Known Allergies Objective:  There were no vitals filed for this visit. There is no height or weight on file to calculate BMI. Constitutional Well developed. Well nourished.  Vascular Dorsalis pedis pulses palpable bilaterally. Posterior tibial pulses palpable bilaterally. Capillary refill normal to all digits.  No cyanosis or clubbing noted. Pedal hair growth normal.  Neurologic Normal speech. Oriented to person, place, and time. Epicritic sensation to light touch grossly present bilaterally.  Dermatologic Nails well groomed and normal in appearance. No open wounds. Multiple verrucous lesions left foot  Orthopedic: Normal joint ROM without pain or crepitus bilaterally. No visible deformities. No bony tenderness.   Radiographs: none Assessment:   1. Plantar wart, left foot   2. Benign skin lesion   3. Pain in left foot    Plan:  Patient was evaluated and treated and all questions answered.  Verruca plantaris -Rx Aldara gel -Follow-up in 4 weeks for recheck  Return in about 4 weeks (around 01/29/2018) for Verruca f/u.

## 2018-02-04 ENCOUNTER — Ambulatory Visit: Payer: BLUE CROSS/BLUE SHIELD | Admitting: Podiatry

## 2018-02-04 DIAGNOSIS — B070 Plantar wart: Secondary | ICD-10-CM

## 2018-02-04 DIAGNOSIS — L989 Disorder of the skin and subcutaneous tissue, unspecified: Secondary | ICD-10-CM

## 2018-02-07 NOTE — Progress Notes (Signed)
  Subjective:  Patient ID: Chad Stephenson, male    DOB: [birth year],  MRN: [id number]  Chief Complaint  Patient presents with  . Plantar Warts    left foot  - 4 week follow up    42 y.o. male presents with the above complaint.  States that since last visit he has had a lot more lesions pop up although they are not painful like they were previously.  Does think that the gel is helping to prevent some of the pain  Review of Systems: Negative except as noted in the HPI. Denies N/V/F/Ch.  No past medical history on file.  Current Outpatient Medications:  .  atorvastatin (LIPITOR) 10 MG tablet, Take 10 mg by mouth every evening., Disp: , Rfl: 4 .  imiquimod (ALDARA) 5 % cream, Apply topically 2 (two) times a week., Disp: 12 each, Rfl: 0  Social History   Tobacco Use  Smoking Status Never Smoker  Smokeless Tobacco Never Used    No Known Allergies Objective:  There were no vitals filed for this visit. There is no height or weight on file to calculate BMI. Constitutional Well developed. Well nourished.  Vascular Dorsalis pedis pulses palpable bilaterally. Posterior tibial pulses palpable bilaterally. Capillary refill normal to all digits.  No cyanosis or clubbing noted. Pedal hair growth normal.  Neurologic Normal speech. Oriented to person, place, and time. Epicritic sensation to light touch grossly present bilaterally.  Dermatologic Nails well groomed and normal in appearance. No open wounds. Multiple verrucous lesions left foot  Orthopedic: Normal joint ROM without pain or crepitus bilaterally. No visible deformities. No bony tenderness.   Radiographs: none Assessment:   1. Plantar wart, left foot   2. Benign skin lesion    Plan:  Patient was evaluated and treated and all questions answered.  Verruca plantaris -Significant lesion debridement to the plantar foot left foot followed by application of Cantharone and lidocaine.  Educated to wash off the area in 6 hours.   Continue Aldara.  No follow-ups on file.

## 2018-02-23 ENCOUNTER — Other Ambulatory Visit: Payer: Self-pay | Admitting: Internal Medicine

## 2018-02-23 DIAGNOSIS — R7989 Other specified abnormal findings of blood chemistry: Secondary | ICD-10-CM

## 2018-02-23 DIAGNOSIS — R945 Abnormal results of liver function studies: Principal | ICD-10-CM

## 2018-02-25 ENCOUNTER — Ambulatory Visit: Payer: BLUE CROSS/BLUE SHIELD | Admitting: Podiatry

## 2018-03-01 ENCOUNTER — Ambulatory Visit
Admission: RE | Admit: 2018-03-01 | Discharge: 2018-03-01 | Disposition: A | Discharge status: Home / Self Care | Payer: BLUE CROSS/BLUE SHIELD | Source: Ambulatory Visit | Attending: Internal Medicine | Admitting: Internal Medicine

## 2018-03-01 DIAGNOSIS — R945 Abnormal results of liver function studies: Principal | ICD-10-CM

## 2018-03-01 DIAGNOSIS — R7989 Other specified abnormal findings of blood chemistry: Secondary | ICD-10-CM

## 2018-03-04 ENCOUNTER — Encounter: Payer: Self-pay | Admitting: Podiatry

## 2018-03-04 ENCOUNTER — Ambulatory Visit: Payer: BLUE CROSS/BLUE SHIELD | Admitting: Podiatry

## 2018-03-04 DIAGNOSIS — B070 Plantar wart: Secondary | ICD-10-CM | POA: Diagnosis not present

## 2018-03-04 DIAGNOSIS — L989 Disorder of the skin and subcutaneous tissue, unspecified: Secondary | ICD-10-CM

## 2018-03-12 IMAGING — DX DG HAND COMPLETE 3+V*R*
3 series · 3 of 3 positions shown · non-contrast
Comparison: Non

CLINICAL DATA: Work injury RIGHT index finger, degloving injury

EXAM:
RIGHT HAND - COMPLETE 3+ VIEW

[x hand pa right]
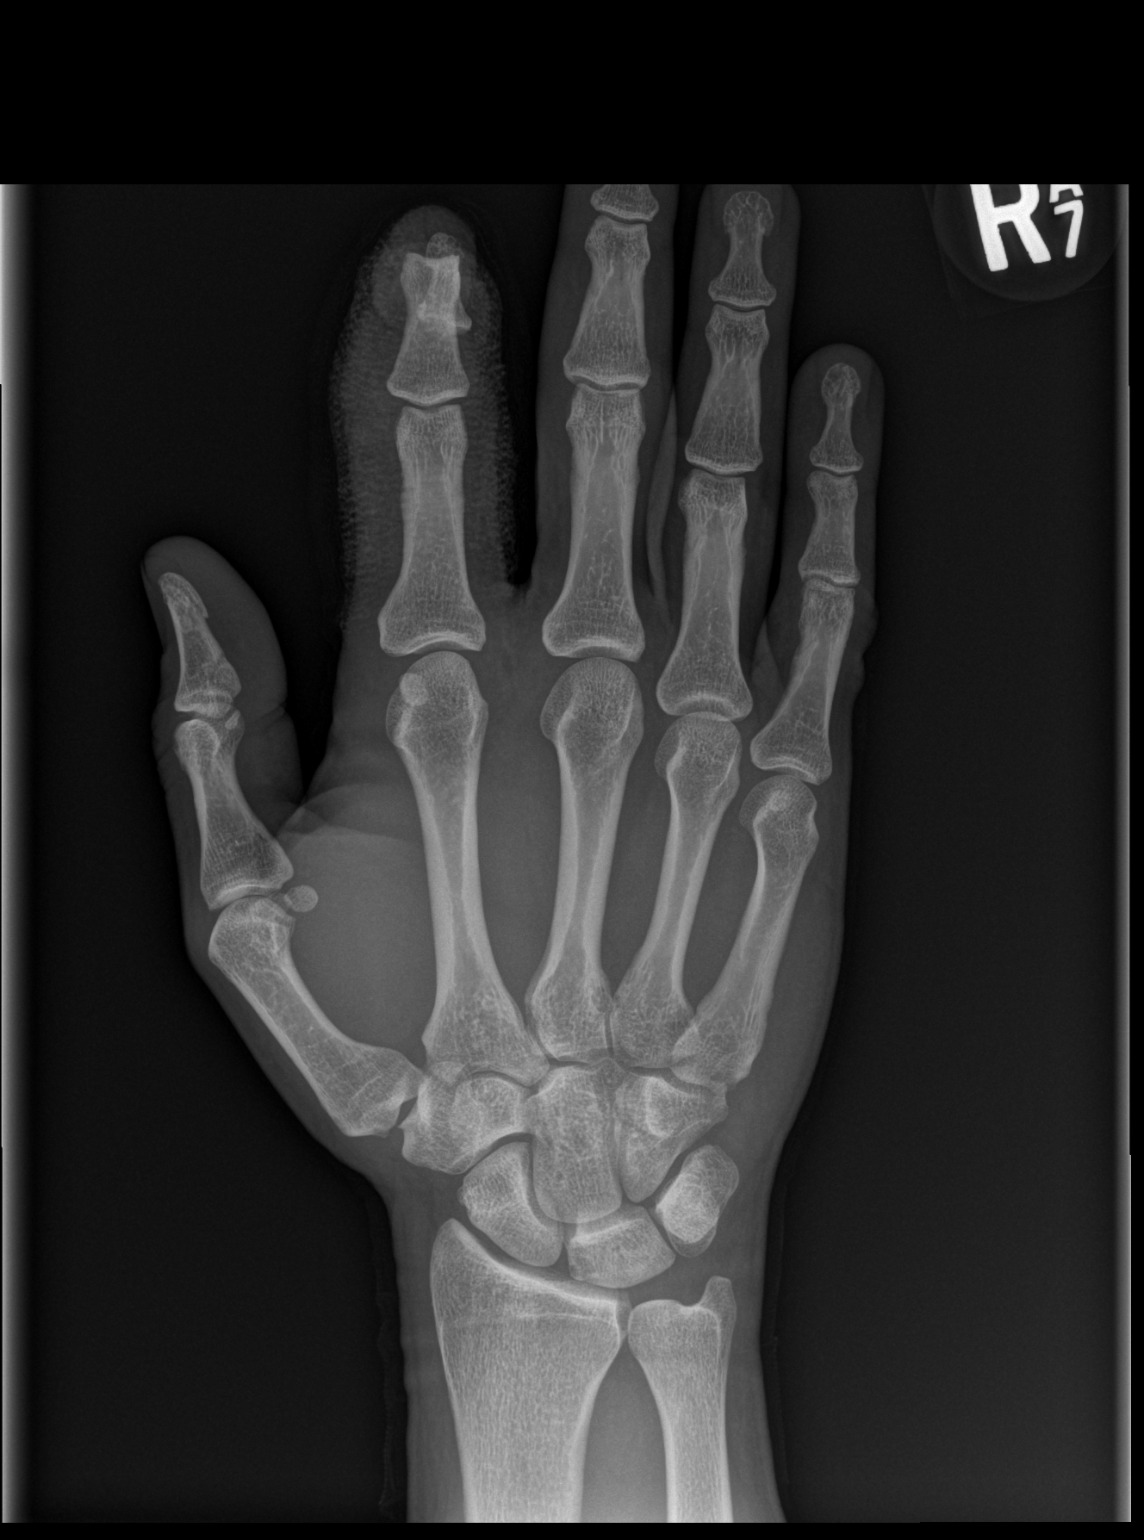

[x hand obl right]
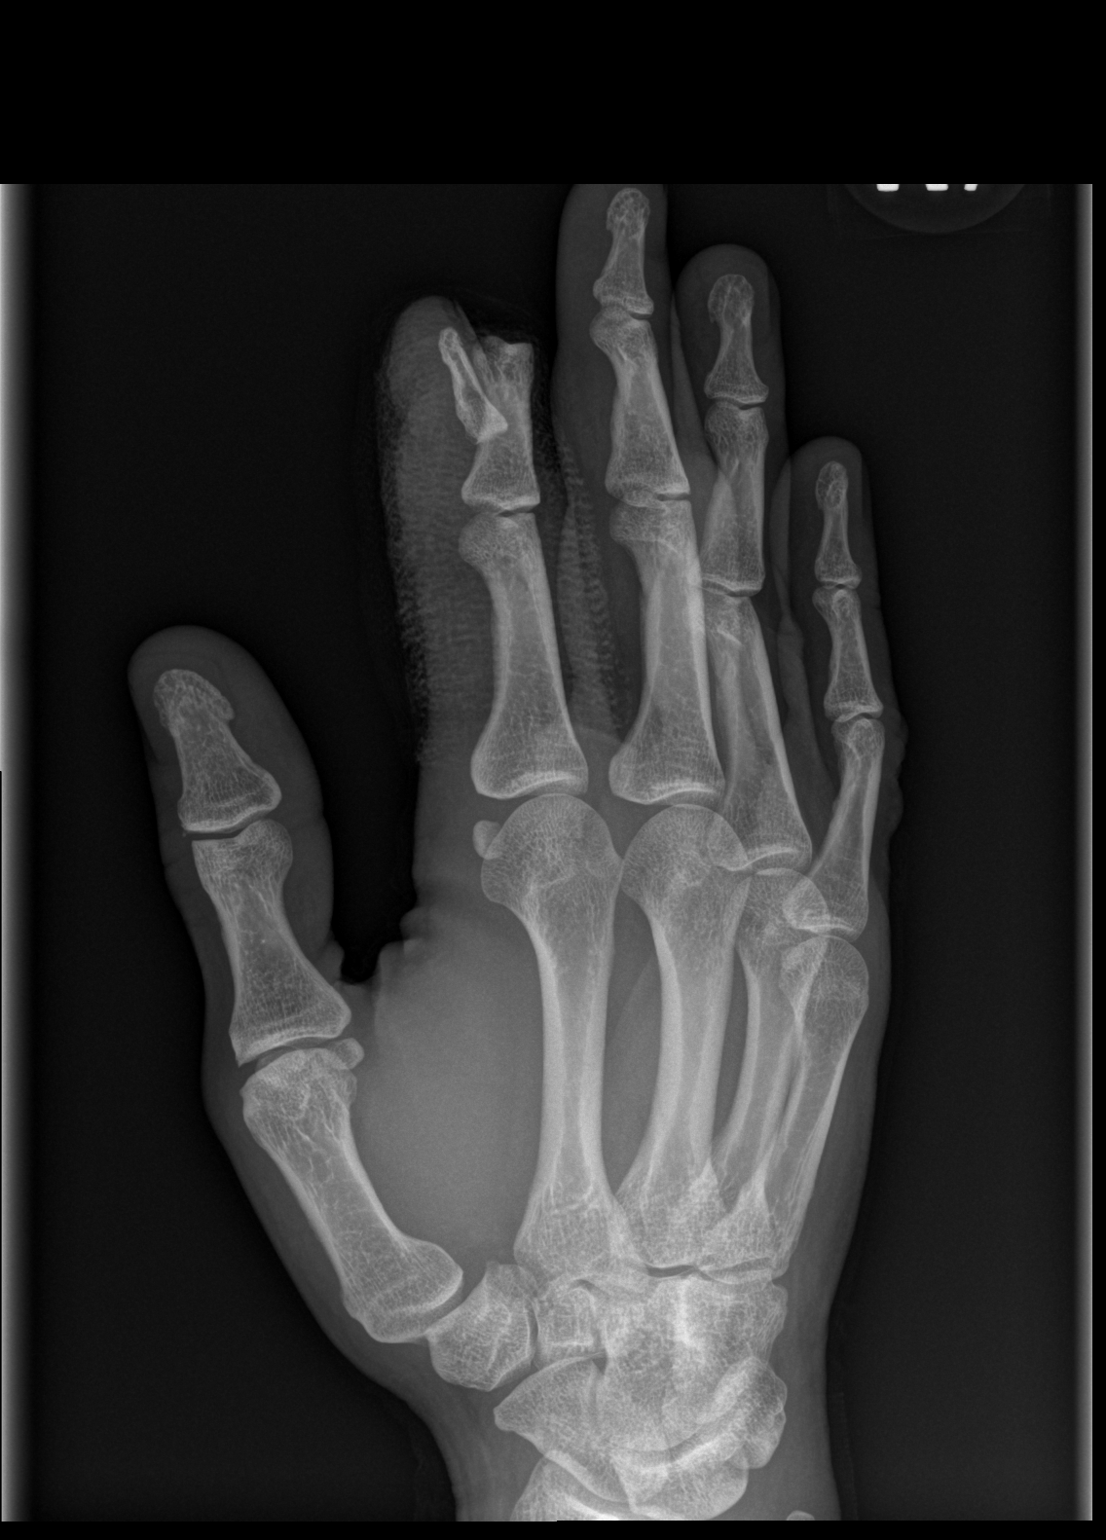

[x hand lat right]
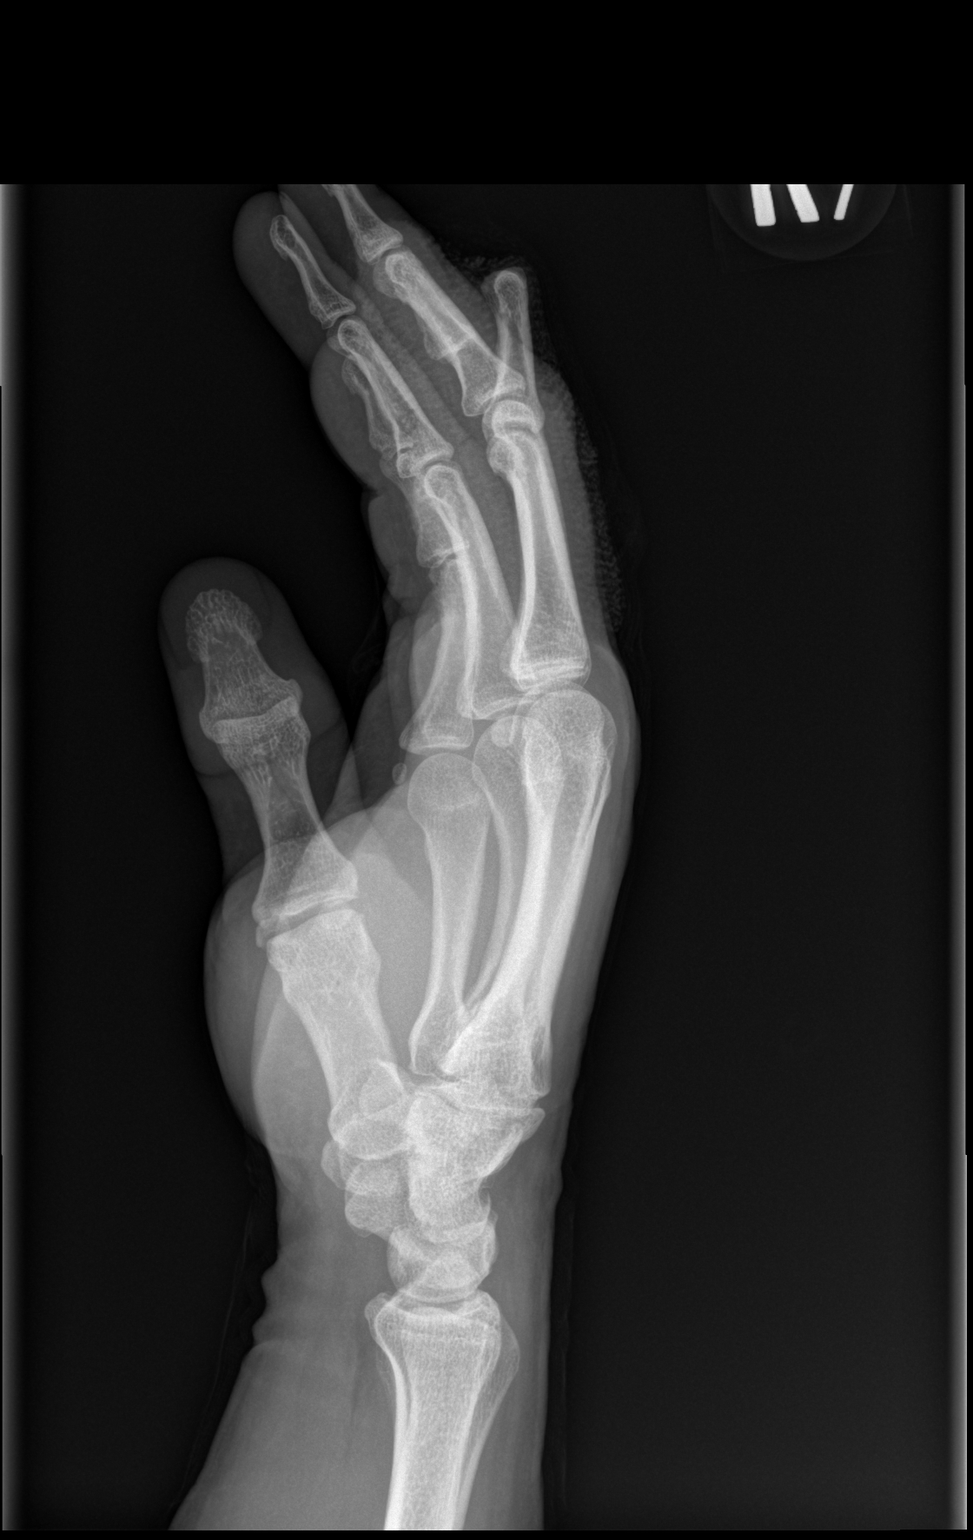

[3 of 3 positions shown; findings below may reference images not displayed]

FINDINGS: Dressing artifacts at index finger.

Osseous mineralization normal.

Soft tissue swelling RIGHT index finger.

Joint spaces preserved.

Volar PIP dislocation RIGHT index finger.

No fracture, dislocation or bone destruction.
IMPRESSION: Volar PIP dislocation RIGHT index finger.

## 2018-03-15 NOTE — Progress Notes (Signed)
  Subjective:  Patient ID: Chad Stephenson, male    DOB: [birth year],  MRN: [id number]  Chief Complaint  Patient presents with  . Plantar Warts    left foot follow up; pt stated, "doing better, no pain, no other concerns"    42 y.o. male presents with the above complaint.  Thinks the area is doing much better not having any pain.  Review of Systems: Negative except as noted in the HPI. Denies N/V/F/Ch.  History reviewed. No pertinent past medical history.  Current Outpatient Medications:  .  atorvastatin (LIPITOR) 10 MG tablet, Take 10 mg by mouth every evening., Disp: , Rfl: 4 .  imiquimod (ALDARA) 5 % cream, Apply topically 2 (two) times a week., Disp: 12 each, Rfl: 0  Social History   Tobacco Use  Smoking Status Never Smoker  Smokeless Tobacco Never Used    No Known Allergies Objective:  There were no vitals filed for this visit. There is no height or weight on file to calculate BMI. Constitutional Well developed. Well nourished.  Vascular Dorsalis pedis pulses palpable bilaterally. Posterior tibial pulses palpable bilaterally. Capillary refill normal to all digits.  No cyanosis or clubbing noted. Pedal hair growth normal.  Neurologic Normal speech. Oriented to person, place, and time. Epicritic sensation to light touch grossly present bilaterally.  Dermatologic Nails well groomed and normal in appearance. No open wounds. Multiple hyperkeratotic lesions to both feet without evidence of active verruca  Orthopedic: Normal joint ROM without pain or crepitus bilaterally. No visible deformities. No bony tenderness.   Radiographs: none Assessment:   1. Plantar wart, left foot   2. Benign skin lesion    Plan:  Patient was evaluated and treated and all questions answered.  Verruca plantaris -Lesions appear significantly improved no evidence of active continued wart formation.  Discussed return should the issues recur.  Return if symptoms worsen or fail to improve.

## 2018-04-02 ENCOUNTER — Encounter: Payer: Self-pay | Admitting: Nurse Practitioner

## 2018-04-13 ENCOUNTER — Encounter: Payer: Self-pay | Admitting: Internal Medicine

## 2018-04-14 ENCOUNTER — Ambulatory Visit: Payer: Self-pay | Admitting: Nurse Practitioner

## 2018-04-14 ENCOUNTER — Encounter: Payer: Self-pay | Admitting: Nurse Practitioner

## 2018-06-16 ENCOUNTER — Ambulatory Visit: Payer: Self-pay | Admitting: Nurse Practitioner

## 2018-06-22 ENCOUNTER — Encounter: Payer: Self-pay | Admitting: Internal Medicine

## 2018-06-23 ENCOUNTER — Encounter: Payer: Self-pay | Admitting: Nurse Practitioner

## 2018-06-23 ENCOUNTER — Ambulatory Visit (INDEPENDENT_AMBULATORY_CARE_PROVIDER_SITE_OTHER): Payer: BLUE CROSS/BLUE SHIELD | Admitting: Nurse Practitioner

## 2018-06-23 ENCOUNTER — Other Ambulatory Visit: Payer: Self-pay

## 2018-06-23 ENCOUNTER — Ambulatory Visit: Payer: BLUE CROSS/BLUE SHIELD | Admitting: Nurse Practitioner

## 2018-06-23 DIAGNOSIS — R945 Abnormal results of liver function studies: Secondary | ICD-10-CM | POA: Diagnosis not present

## 2018-06-23 DIAGNOSIS — R7989 Other specified abnormal findings of blood chemistry: Secondary | ICD-10-CM

## 2018-06-23 NOTE — Progress Notes (Signed)
Referring Provider: Ignatius SpeckingVyas, Dhruv B, MD Primary Care Physician:  Ignatius SpeckingVyas, Dhruv B, MD Primary GI:  Dr. Jena Gaussourk  NOTE: Service was provided via telemedicine and was requested by the patient due to COVID-19 pandemic.  Method of visit: FaceTime  Patient Location: Home  Provider Location: Office  Reason for Phone Visit: New patient referral for abnormal labs  The patient was consented to phone follow-up via telephone encounter including billing of the encounter (yes/no): Yes  Persons present on the phone encounter, with roles: Wife  Total time (minutes) spent on medical discussion: 15 minutes  Chief Complaint  Patient presents with  . abnormal liver enzymes    PCP wants to start cholesterol med    HPI:   Chad Stephenson is a 43 y.o. male who presents for virtual visit regarding: Referral from primary care for abnormal labs.  Reviewed information provided with referral including office visit dated 04/06/2018 which notes abnormal liver test over the past few years, patient on statin which improved cholesterol but needed persistent elevated LFTs.  At the time of last office visit labs were noted to be slightly improved, statin discontinued, previous negative ultrasound and recommended repeat liver test in 3 months and referral to GI.  Complete abdominal ultrasound completed 03/01/2018 which found normal study.  Specifically normal, bile duct diameter, no focal lesion in the liver and normal limits and parenchymal echogenicity.  Labs completed 01/08/2018 found AST/ALT at 84/101, prior to that on 12/30/2016 AST normal at 39, ALT mildly elevated at 73.  Prior to that LFTs normal.  All labs have shown normal bilirubin and alkaline phosphatase.  Most recent labs completed 02/21/2018 with AST/ALT at 57/94, and again repeated on 04/03/2018 which found AST/ALT mildly improved to 43/73.  Today he states he's doing well overall.Denies abdominal pain, N/V, hematochezia, melena, fever, chills, unintentional  weight loss. Denies yellowing of skin/eyes, darkened urine, acute episodic confusion, tremors. Rare ETOH around 12-15 beers a year. Denies URI and flu-like symptoms. Denies chest pain, dyspnea, dizziness, lightheadedness, syncope, near syncope. Denies any other upper or lower GI symptoms.  Past Medical History:  Diagnosis Date  . Hypercholesterolemia     Past Surgical History:  Procedure Laterality Date  . AMPUTATION Right 11/28/2015   Procedure: REVISION MINOR AMPUTATION OF DIGIT RIGHT INDEX FINGER;  Surgeon: Bradly BienenstockFred Ortmann, MD;  Location: [facility] OR;  Service: Orthopedics;  Laterality: Right;  . ANTERIOR CRUCIATE LIGAMENT REPAIR    . BACK SURGERY      Current Outpatient Medications  Medication Sig Dispense Refill  . atorvastatin (LIPITOR) 10 MG tablet Take 10 mg by mouth every evening.  4  . imiquimod (ALDARA) 5 % cream Apply topically 2 (two) times a week. (Patient not taking: Reported on 06/23/2018) 12 each 0   No current facility-administered medications for this visit.     Allergies as of 06/23/2018  . (No Known Allergies)    Family History  Problem Relation Age of Onset  . Colon cancer Neg Hx   . Liver disease Neg Hx     Social History   Socioeconomic History  . Marital status: Married    Spouse name: Not on file  . Number of children: Not on file  . Years of education: Not on file  . Highest education level: Not on file  Occupational History  . Not on file  Social Needs  . Financial resource strain: Not on file  . Food insecurity:    Worry: Not on file  Inability: Not on file  . Transportation needs:    Medical: Not on file    Non-medical: Not on file  Tobacco Use  . Smoking status: Never Smoker  . Smokeless tobacco: Never Used  Substance and Sexual Activity  . Alcohol use: Yes    Comment: rare  . Drug use: No  . Sexual activity: Not on file  Lifestyle  . Physical activity:    Days per week: Not on file    Minutes per session: Not on file  . Stress: Not  on file  Relationships  . Social connections:    Talks on phone: Not on file    Gets together: Not on file    Attends religious service: Not on file    Active member of club or organization: Not on file    Attends meetings of clubs or organizations: Not on file    Relationship status: Not on file  Other Topics Concern  . Not on file  Social History Narrative  . Not on file    Review of Systems: General: Negative for anorexia, weight loss, fever, chills, fatigue, weakness. ENT: Negative for hoarseness, difficulty swallowing. CV: Negative for chest pain, angina, palpitations, peripheral edema.  Respiratory: Negative for dyspnea at rest, cough, sputum, wheezing.  GI: See history of present illness. MS: Negative for joint pain, low back pain.  Derm: Negative for rash or itching.  Endo: Negative for unusual weight change.  Heme: Negative for bruising or bleeding. Allergy: Negative for rash or hives.  Physical Exam: Note: limited exam due to virtual visit General:   Alert and oriented. Pleasant and cooperative. Well-nourished and well-developed.  Head:  Normocephalic and atraumatic. Eyes:  Without icterus, sclera clear and conjunctiva pink.  Ears:  Normal auditory acuity. Skin:  Intact without facial significant lesions or rashes. Neurologic:  Alert and oriented x4;  grossly normal neurologically. Psych:  Alert and cooperative. Normal mood and affect. Heme/Lymph/Immune: No excessive bruising noted.

## 2018-06-23 NOTE — Assessment & Plan Note (Addendum)
The patient has had a several month history of mildly elevated transaminases.  Alkaline phosphatase, albumin, bilirubin have all been normal.  Abdominal ultrasound with no abnormalities.  Specifically, no significant gallbladder disease, liver parenchyma normal, no common bile duct dilation.  At this point his Lipitor is being held.  I have recommended he eliminate alcohol while we are working this up (generally he only drinks about 15 beers a year).  We will check remaining viral hepatitis labs for a and B.  I will recheck a CBC, BMP, hepatic function panel, INR, iron, ferritin.  I feel giving the low level if his transaminitis and no overt risks for typical fatty liver disease, this is likely a reaction to his statin.  However, we will pursue an adequate work-up.  Return for follow-up in 4 months  If transaminitis is deemed likely due to statin use, the primary care provider can decide if they would like to try another cholesterol medication.  Generally progression to severe liver disease on statins is comparable to placebo in research studies.  Per up-to-date, is recommended to reduce her eliminate statins in patients with ALT greater than 3 times upper limit normal (ULN) that is confirmed on a second occasion.  At this time it does not appear the patient meets this criteria.

## 2018-06-23 NOTE — Patient Instructions (Signed)
Your health issues we discussed today were:   Elevated liver labs: 1. Have your labs completed when you are able to 2. Keep in mind, some labs may not want to draw "routine" labs during the COVID-19 pandemic 3. Although you do not drink much, do not drink any alcohol while we are evaluating your liver 4. We will call you with results  Overall I recommend:  1. Return for follow-up in 4 months 2. Call us if you have any questions or concerns   Because of recent events of COVID-19 ("Coronavirus"), follow CDC recommendations:  Wash your hand frequently Avoid touching your face Stay away from people who are sick If you have symptoms such as fever, cough, shortness of breath then call your healthcare provider for further guidance If you are sick, STAY AT HOME unless otherwise directed by your healthcare provider. Follow directions from state and national officials regarding staying safe   At [facility] we value your feedback. You may receive a survey about your visit today. Please share your experience as we strive to create trusting relationships with our patients to provide genuine, compassionate, quality care.  We appreciate your understanding and patience as we review any laboratory studies, imaging, and other diagnostic tests that are ordered as we care for you. Our office policy is 5 business days for review of these results, and any emergent or urgent results are addressed in a timely manner for your best interest. If you do not hear from our office in 1 week, please contact us.   We also encourage the use of MyChart, which contains your medical information for your review as well. If you are not enrolled in this feature, an access code is on this after visit summary for your convenience. Thank you for allowing [place] to be involved in your care.  It was great to see you today!  I hope you have a great day!!

## 2018-06-24 ENCOUNTER — Encounter: Payer: Self-pay | Admitting: Internal Medicine

## 2018-06-24 NOTE — Progress Notes (Signed)
cc'ed to pcp °

## 2018-07-26 LAB — BASIC METABOLIC PANEL
BUN: 11 mg/dL (ref 7–25)
CO2: 29 mmol/L (ref 20–32)
Calcium: 9.5 mg/dL (ref 8.6–10.3)
Chloride: 104 mmol/L (ref 98–110)
Creat: 0.98 mg/dL (ref 0.60–1.35)
Glucose, Bld: 91 mg/dL (ref 65–99)
Potassium: 4.3 mmol/L (ref 3.5–5.3)
Sodium: 140 mmol/L (ref 135–146)

## 2018-07-26 LAB — HEPATITIS A ANTIBODY, TOTAL: Hepatitis A AB,Total: NONREACTIVE

## 2018-07-26 LAB — PROTIME-INR
INR: 1
Prothrombin Time: 10.4 s (ref 9.0–11.5)

## 2018-07-26 LAB — HEPATIC FUNCTION PANEL
AG Ratio: 2.2 (calc) (ref 1.0–2.5)
ALT: 80 U/L — ABNORMAL HIGH (ref 9–46)
AST: 39 U/L (ref 10–40)
Albumin: 4.7 g/dL (ref 3.6–5.1)
Alkaline phosphatase (APISO): 69 U/L (ref 36–130)
Bilirubin, Direct: 0.1 mg/dL (ref 0.0–0.2)
Globulin: 2.1 g/dL (calc) (ref 1.9–3.7)
Indirect Bilirubin: 0.6 mg/dL (calc) (ref 0.2–1.2)
Total Bilirubin: 0.7 mg/dL (ref 0.2–1.2)
Total Protein: 6.8 g/dL (ref 6.1–8.1)

## 2018-07-26 LAB — CBC WITH DIFFERENTIAL/PLATELET
Absolute Monocytes: 442 cells/uL (ref 200–950)
Basophils Absolute: 52 cells/uL (ref 0–200)
Basophils Relative: 1 %
Eosinophils Absolute: 182 cells/uL (ref 15–500)
Eosinophils Relative: 3.5 %
HCT: 48.4 % (ref 38.5–50.0)
Hemoglobin: 16.8 g/dL (ref 13.2–17.1)
Lymphs Abs: 1908 cells/uL (ref 850–3900)
MCH: 31 pg (ref 27.0–33.0)
MCHC: 34.7 g/dL (ref 32.0–36.0)
MCV: 89.3 fL (ref 80.0–100.0)
MPV: 10.3 fL (ref 7.5–12.5)
Monocytes Relative: 8.5 %
Neutro Abs: 2616 cells/uL (ref 1500–7800)
Neutrophils Relative %: 50.3 %
Platelets: 278 10*3/uL (ref 140–400)
RBC: 5.42 10*6/uL (ref 4.20–5.80)
RDW: 12.6 % (ref 11.0–15.0)
Total Lymphocyte: 36.7 %
WBC: 5.2 10*3/uL (ref 3.8–10.8)

## 2018-07-26 LAB — HEPATITIS B SURFACE ANTIBODY,QUALITATIVE: Hep B S Ab: NONREACTIVE

## 2018-07-26 LAB — HEPATITIS B SURFACE ANTIGEN: Hepatitis B Surface Ag: NONREACTIVE

## 2018-07-26 LAB — HEPATITIS B CORE ANTIBODY, TOTAL: Hep B Core Total Ab: NONREACTIVE

## 2018-07-26 LAB — IRON: Iron: 150 ug/dL (ref 50–180)

## 2018-07-26 LAB — FERRITIN: Ferritin: 324 ng/mL (ref 38–380)

## 2018-09-23 ENCOUNTER — Ambulatory Visit: Payer: BLUE CROSS/BLUE SHIELD | Admitting: Nurse Practitioner

## 2018-10-21 ENCOUNTER — Telehealth: Payer: Self-pay | Admitting: *Deleted

## 2018-10-21 NOTE — Telephone Encounter (Signed)
Spoke with pt. His lab results were given. Pt wasn't aware that the results were in my chart. Pt cancelled his apt for 10/26/2018. Pt will call back if wanted.

## 2018-10-21 NOTE — Telephone Encounter (Signed)
Pt wants to know if he can get his lab results reviewed by phone instead of coming in for an ov.  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  [phone]

## 2018-10-26 ENCOUNTER — Ambulatory Visit: Payer: BLUE CROSS/BLUE SHIELD | Admitting: Nurse Practitioner

## 2019-06-25 IMAGING — US US ABDOMEN COMPLETE
1 series · 14 of 25 positions shown · non-contrast
Comparison: None.

CLINICAL DATA: Elevated liver enzymes

EXAM:
ABDOMEN ULTRASOUND COMPLETE

[Series 1: us abdomen complete · 0.17mm/px · 14 of 77 slices shown]
[im 1/77]
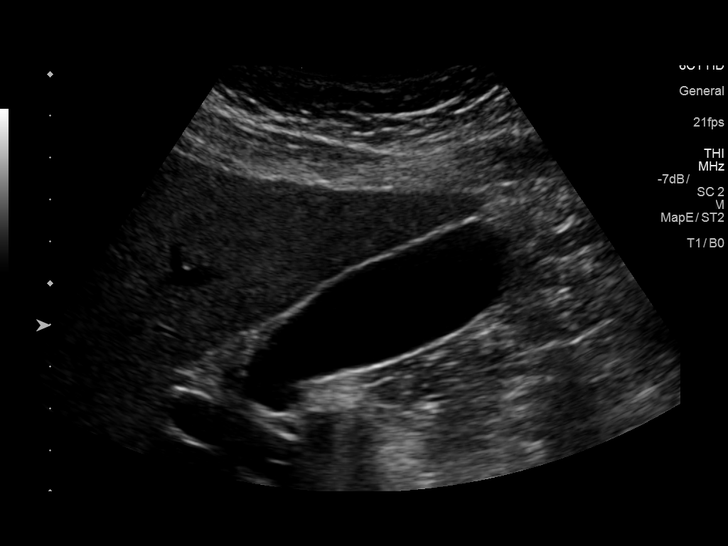
[im 7/77]
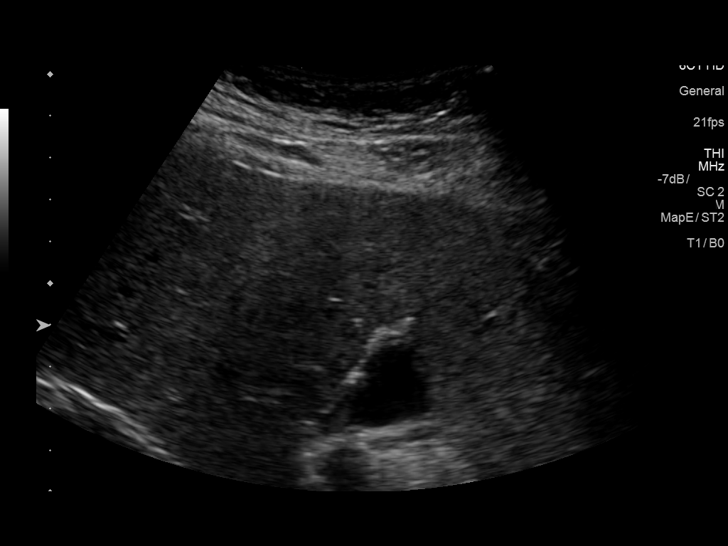
[im 13/77]
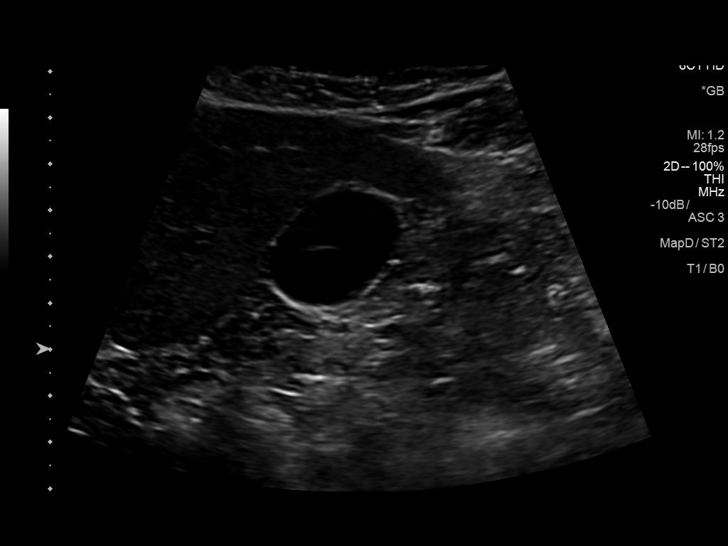
[im 20/77]
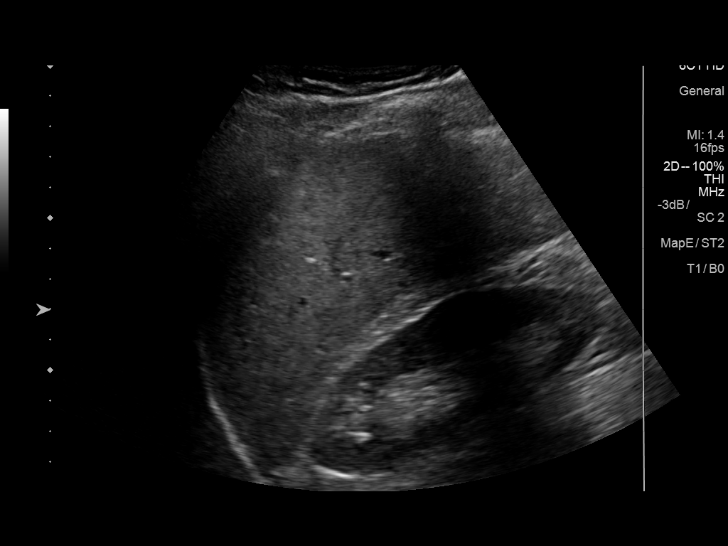
[im 26/77]
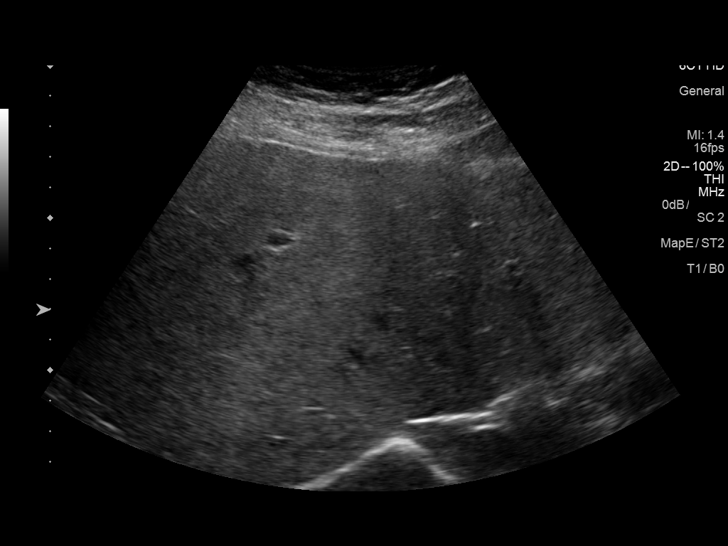
[im 29/77]
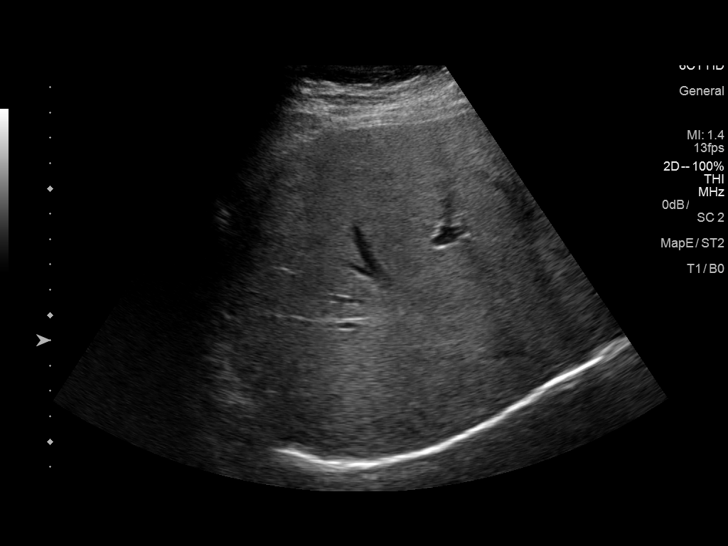
[im 35/77]
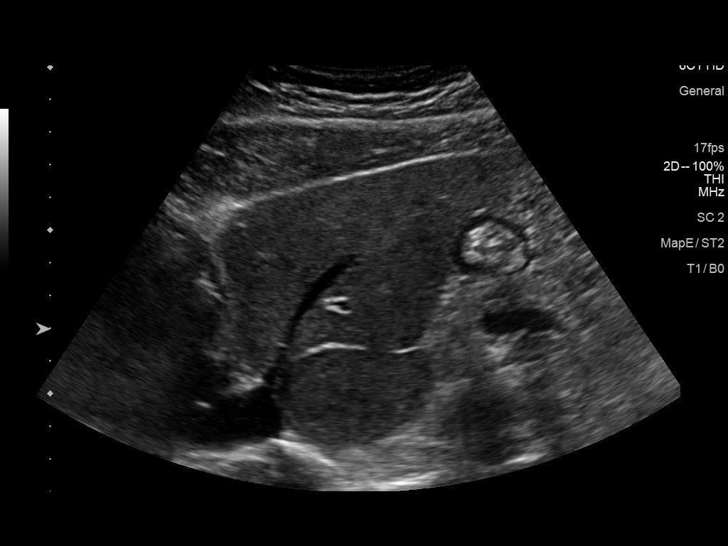
[im 42/77]
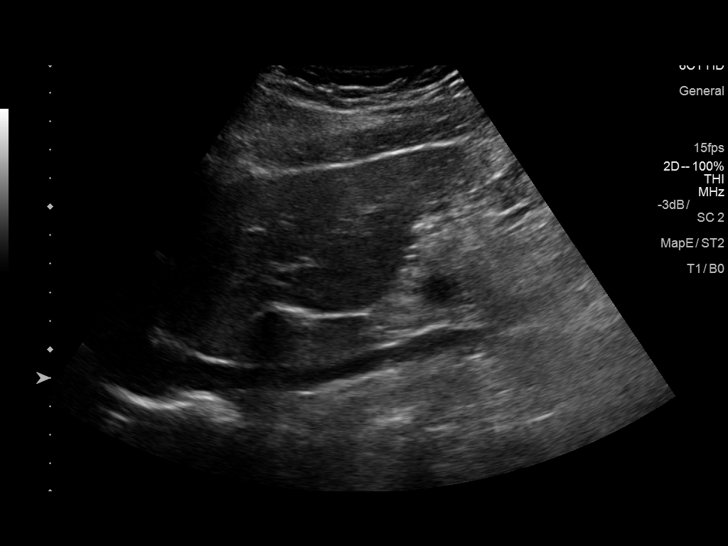
[im 48/77]
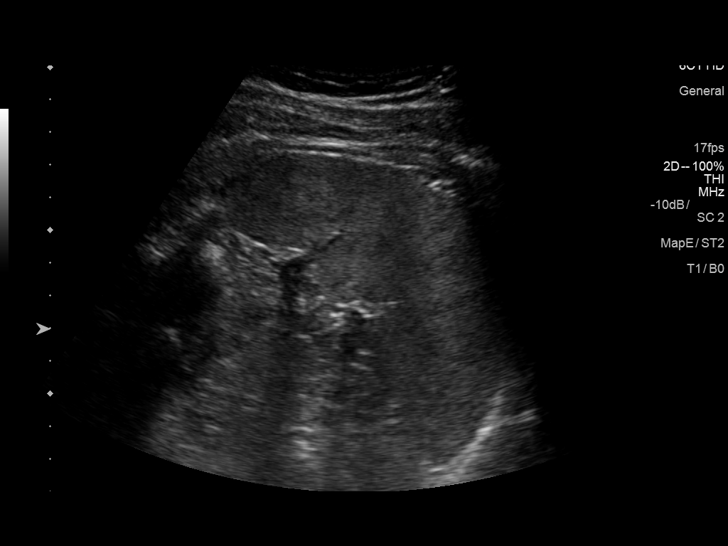
[im 51/77]
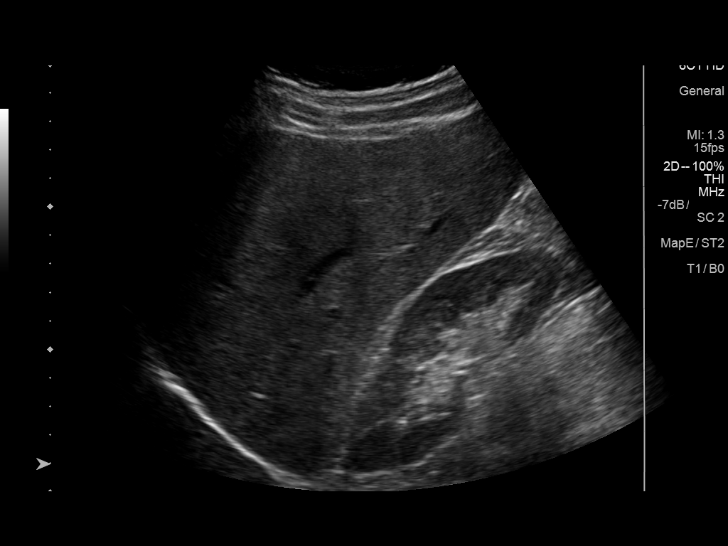
[im 58/77]
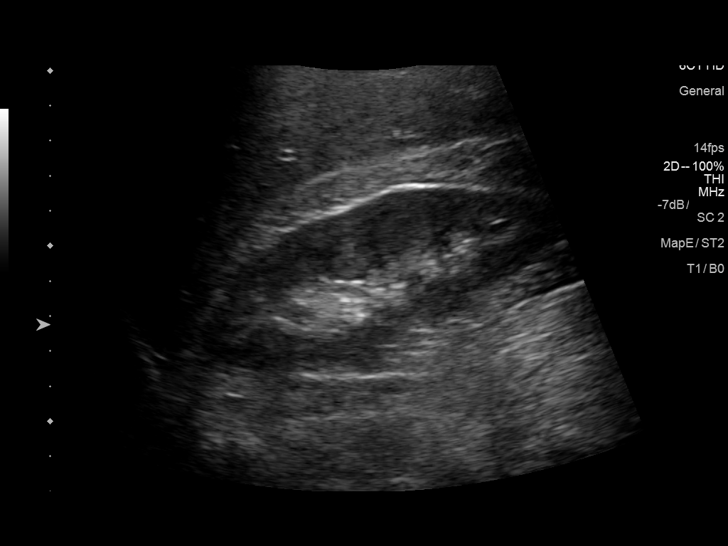
[im 64/77]
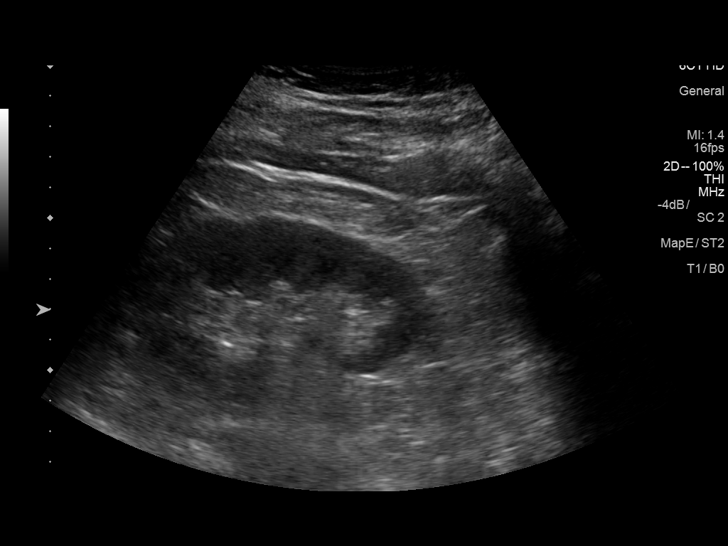
[im 70/77]
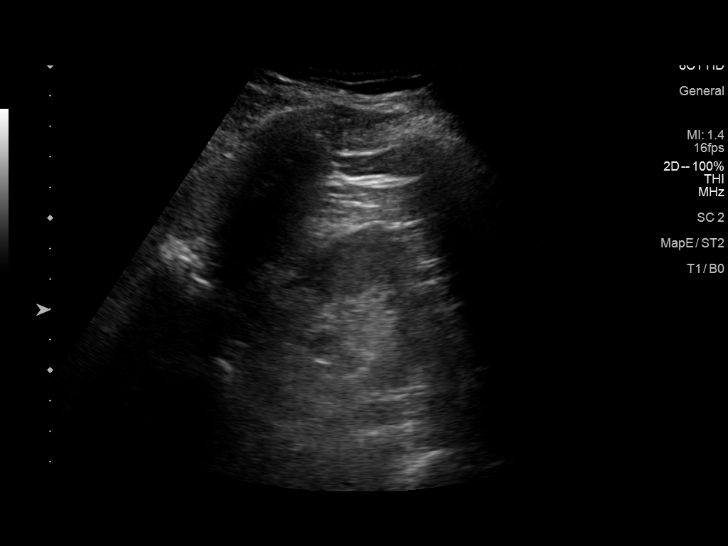
[im 77/77]
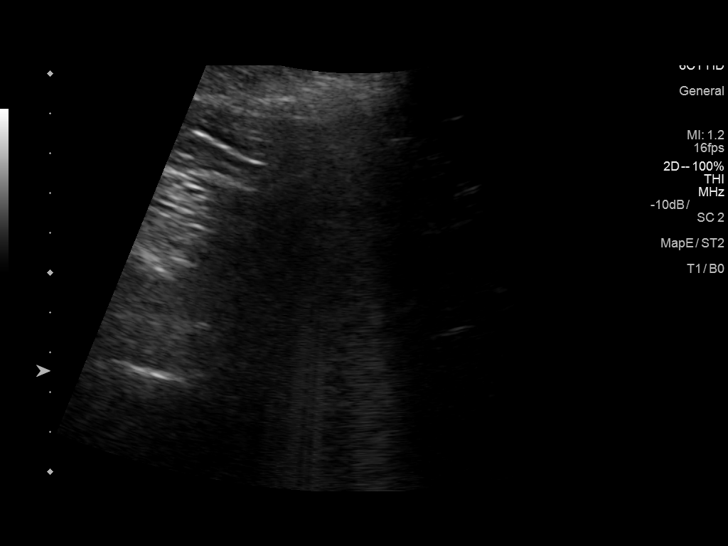

[14 of 25 positions shown; findings below may reference images not displayed]

FINDINGS: Gallbladder: No gallstones or wall thickening visualized. There is
no pericholecystic fluid. No sonographic Murphy sign noted by
sonographer.

Common bile duct: Diameter: 3 mm. No intrahepatic, common hepatic,
or common bile duct dilatation.

Liver: No focal lesion identified. Within normal limits in
parenchymal echogenicity. Portal vein is patent on color Doppler
imaging with normal direction of blood flow towards the liver.

IVC: No abnormality visualized.

Pancreas: No pancreatic mass or inflammatory focus.

Spleen: Size and appearance within normal limits.

Right Kidney: Length: 10.9 cm. Echogenicity within normal limits. No
mass or hydronephrosis visualized.

Left Kidney: Length: 12.5 cm. Echogenicity within normal limits. No
mass or hydronephrosis visualized.

Abdominal aorta: No aneurysm visualized.

Other findings: No demonstrable ascites.
IMPRESSION: Study within normal limits.

## 2019-10-07 ENCOUNTER — Encounter: Payer: Self-pay | Admitting: Emergency Medicine

## 2019-10-07 ENCOUNTER — Ambulatory Visit
Admission: EM | Admit: 2019-10-07 | Discharge: 2019-10-07 | Disposition: A | Discharge status: Home / Self Care | Payer: BC Managed Care – PPO | Attending: Emergency Medicine | Admitting: Emergency Medicine

## 2019-10-07 ENCOUNTER — Other Ambulatory Visit: Payer: Self-pay

## 2019-10-07 DIAGNOSIS — H66002 Acute suppurative otitis media without spontaneous rupture of ear drum, left ear: Secondary | ICD-10-CM

## 2019-10-07 DIAGNOSIS — B9789 Other viral agents as the cause of diseases classified elsewhere: Secondary | ICD-10-CM

## 2019-10-07 MED ORDER — DEXAMETHASONE SODIUM PHOSPHATE 10 MG/ML IJ SOLN
10.0000 mg | Freq: Once | INTRAMUSCULAR | Status: AC
  Administered 2019-10-07: 10 mg via INTRAMUSCULAR

## 2019-10-07 MED ORDER — AMOXICILLIN 500 MG PO CAPS: 500.0000 mg | ORAL_CAPSULE | Freq: Three times a day (TID) | ORAL | 0 refills | Status: AC

## 2019-10-07 NOTE — ED Provider Notes (Signed)
[facility]   [id number] 10/07/19 Arrival Time: 1454   CC: URI  SUBJECTIVE: History from: patient.  Chad Stephenson is a 44 y.o. male who presents with ear pain, sinus pain, pressure and congestion x  2-3 days.  Denies sick exposure to COVID, flu or strep.  Has tried OTC medications without relief.  Symptoms are made worse over the past few days.  Reports previous symptoms in the past with sinus infection and ear infection.   Denies fever, chills, fatigue, sore throat, cough, SOB, wheezing, chest pain, nausea, changes in bowel or bladder habits.     ROS: As per HPI.  All other pertinent ROS negative.     Past Medical History:  Diagnosis Date  . Hypercholesterolemia    Past Surgical History:  Procedure Laterality Date  . 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 ANTERIOR CRUCIATE LIGAMENT REPAIR    . BACK SURGERY     No Known Allergies No current facility-administered medications on file prior to encounter.   Current Outpatient Medications on File Prior to Encounter  Medication Sig Dispense Refill  . atorvastatin (LIPITOR) 10 MG tablet Take 10 mg by mouth every evening.  4  . imiquimod (ALDARA) 5 % cream Apply topically 2 (two) times a week. (Patient not taking: Reported on 06/23/2018) 12 each 0   Social History   Socioeconomic History  . Marital status: Married    Spouse name: Not on file  . Number of children: Not on file  . Years of education: Not on file  . Highest education level: Not on file  Occupational History  . Not on file  Tobacco Use  . Smoking status: Never Smoker  . Smokeless tobacco: Never Used  Substance and Sexual Activity  . Alcohol use: Yes    Comment: rare  . Drug use: No  . Sexual activity: Not on file  Other Topics Concern  . Not on file  Social History Narrative  . Not on file   Social  Determinants of Health   Financial Resource Strain:   . Difficulty of Paying Living Expenses:   Food Insecurity:   . Worried About Programme researcher, broadcasting/film/video in the Last Year:   . Barista in the Last Year:   Transportation Needs:   . Freight forwarder (Medical):   Marland Kitchen Lack of Transportation (Non-Medical):   Physical Activity:   . Days of Exercise per Week:   . Minutes of Exercise per Session:   Stress:   . Feeling of Stress :   Social Connections:   . Frequency of Communication with Friends and Family:   . Frequency of Social Gatherings with Friends and Family:   . Attends Religious Services:   . Active Member of Clubs or Organizations:   . Attends Banker Meetings:   Marland Kitchen Marital Status:   Intimate Partner Violence:   . Fear of Current or Ex-Partner:   . Emotionally Abused:   Marland Kitchen Physically Abused:   . Sexually Abused:    Family History  Problem Relation Age of Onset  . Colon cancer Neg Hx   . Liver disease Neg Hx     OBJECTIVE:  Vitals:   10/07/19 1501  BP: (!) 139/94  Pulse: 75  Resp: 17  Temp: 98.3 F (36.8 C)  TempSrc: Oral  SpO2: 98%  General appearance: alert; appears mildly fatigued, but nontoxic; speaking in full sentences and tolerating own secretions HEENT: NCAT; Ears: EACs clear, RT TM pearly gray, LT TM erythematous; Eyes: PERRL.  EOM grossly intact. Nose: nares patent without rhinorrhea, Throat: oropharynx clear, tonsils non erythematous or enlarged, uvula midline  Neck: supple without LAD Lungs: unlabored respirations, symmetrical air entry; cough: absent; no respiratory distress; CTAB Heart: regular rate and rhythm.   Skin: warm and dry Psychological: alert and cooperative; normal mood and affect  ASSESSMENT & PLAN:  1. Non-recurrent acute suppurative otitis media of left ear without spontaneous rupture of tympanic membrane   2. Viral sinusitis     Meds ordered this encounter  Medications  . amoxicillin (AMOXIL) 500 MG  capsule    Sig: Take 1 capsule (500 mg total) by mouth 3 (three) times daily for 10 days.    Dispense:  30 capsule    Refill:  0    Order Specific Question:   Supervising Provider    Answer:   Eustace Moore [[id number]]  . dexamethasone (DECADRON) injection 10 mg   Declines COVID test; received both COVID vaccines Steroid shot given in office Get plenty of rest and push fluids Use OTC zyrtec for nasal congestion, runny nose, and/or sore throat Use OTC flonase for nasal congestion and runny nose Amoxicillin prescribed for ear infection.  Take as directed and to completion Use OTC medications like ibuprofen or tylenol as needed fever or pain Return or go to the ED if you have any new or worsening symptoms such as fever, cough, shortness of breath, chest tightness, chest pain, turning blue, changes in mental status, etc...   Reviewed expectations re: course of current medical issues. Questions answered. Outlined signs and symptoms indicating need for more acute intervention. Patient verbalized understanding. After Visit Summary given.         Rennis Harding, PA-C 10/07/19 1542

## 2019-10-07 NOTE — Discharge Instructions (Signed)
Declines COVID test; received both COVID vaccines Steroid shot given in office Get plenty of rest and push fluids Use OTC zyrtec for nasal congestion, runny nose, and/or sore throat Use OTC flonase for nasal congestion and runny nose Amoxicillin prescribed for ear infection.  Take as directed and to completion Use OTC medications like ibuprofen or tylenol as needed fever or pain Return or go to the ED if you have any new or worsening symptoms such as fever, cough, shortness of breath, chest tightness, chest pain, turning blue, changes in mental status, etc..Marland Kitchen

## 2019-10-07 NOTE — ED Triage Notes (Signed)
Sinus congestion, headache and ear pain x 2-3 days

## 2020-01-02 ENCOUNTER — Ambulatory Visit
Admission: EM | Admit: 2020-01-02 | Discharge: 2020-01-02 | Disposition: A | Discharge status: Home / Self Care | Payer: BC Managed Care – PPO | Attending: Emergency Medicine | Admitting: Emergency Medicine

## 2020-01-02 ENCOUNTER — Encounter: Payer: Self-pay | Admitting: Emergency Medicine

## 2020-01-02 DIAGNOSIS — S40862A Insect bite (nonvenomous) of left upper arm, initial encounter: Secondary | ICD-10-CM

## 2020-01-02 DIAGNOSIS — R21 Rash and other nonspecific skin eruption: Secondary | ICD-10-CM

## 2020-01-02 DIAGNOSIS — W57XXXA Bitten or stung by nonvenomous insect and other nonvenomous arthropods, initial encounter: Secondary | ICD-10-CM

## 2020-01-02 MED ORDER — HYDROCORTISONE 1 % EX LOTN: 1.0000 "application " | TOPICAL_LOTION | Freq: Two times a day (BID) | CUTANEOUS | 0 refills | Status: DC

## 2020-01-02 MED ORDER — DOXYCYCLINE HYCLATE 100 MG PO CAPS: 100.0000 mg | ORAL_CAPSULE | Freq: Two times a day (BID) | ORAL | 0 refills | Status: DC

## 2020-01-02 NOTE — ED Triage Notes (Signed)
Pt has an itchy reddend area that is streaking up his LT upper arm that he noticed on Saturday.  Area has continued to get bigger.

## 2020-01-02 NOTE — ED Provider Notes (Signed)
[facility]   [id number] 01/02/20 Arrival Time: 1628  Chief Complaint  Patient presents with  . Rash     SUBJECTIVE: History from: patient.  Chad Stephenson is a 44 y.o. male presented to the urgent care for complaint of insect bite to left upper arm that he noticed this past Friday.  Denies a precipitating event.  Stated he may be bit by a spider.  Localizes erythema and rash to left upper arm.  Has not tried any OTC medication.  Denies previous hx of insect  bite.  Denies fever, chills, nausea, vomiting, headache, dizziness, weakness, fatigue, or abdominal pain.   ROS: As per HPI.  All other pertinent ROS negative.     Past Medical History:  Diagnosis Date  . Hypercholesterolemia    Past Surgical History:  Procedure Laterality Date  . 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 ANTERIOR CRUCIATE LIGAMENT REPAIR    . BACK SURGERY     No Known Allergies No current facility-administered medications on file prior to encounter.   Current Outpatient Medications on File Prior to Encounter  Medication Sig Dispense Refill  . atorvastatin (LIPITOR) 10 MG tablet Take 10 mg by mouth every evening.  4  . imiquimod (ALDARA) 5 % cream Apply topically 2 (two) times a week. (Patient not taking: Reported on 06/23/2018) 12 each 0   Social History   Socioeconomic History  . Marital status: Married    Spouse name: Not on file  . Number of children: Not on file  . Years of education: Not on file  . Highest education level: Not on file  Occupational History  . Not on file  Tobacco Use  . Smoking status: Never Smoker  . Smokeless tobacco: Never Used  Substance and Sexual Activity  . Alcohol use: Yes    Comment: rare  . Drug use: No  . Sexual activity: Not on file  Other Topics Concern  . Not on file  Social History Narrative  . Not on  file   Social Determinants of Health   Financial Resource Strain:   . Difficulty of Paying Living Expenses: Not on file  Food Insecurity:   . Worried About Programme researcher, broadcasting/film/video in the Last Year: Not on file  . Ran Out of Food in the Last Year: Not on file  Transportation Needs:   . Lack of Transportation (Medical): Not on file  . Lack of Transportation (Non-Medical): Not on file  Physical Activity:   . Days of Exercise per Week: Not on file  . Minutes of Exercise per Session: Not on file  Stress:   . Feeling of Stress : Not on file  Social Connections:   . Frequency of Communication with Friends and Family: Not on file  . Frequency of Social Gatherings with Friends and Family: Not on file  . Attends Religious Services: Not on file  . Active Member of Clubs or Organizations: Not on file  . Attends Banker Meetings: Not on file  . Marital Status: Not on file  Intimate Partner Violence:   . Fear of Current or Ex-Partner: Not on file  . Emotionally Abused: Not on file  . Physically Abused: Not on file  . Sexually Abused: Not on file   Family History  Problem Relation Age of Onset  . Colon cancer Neg Hx   .  Liver disease Neg Hx     OBJECTIVE:  Vitals:   01/02/20 1742 01/02/20 1743  BP:  138/86  Pulse:  69  Resp:  17  Temp:  98.3 F (36.8 C)  TempSrc:  Oral  SpO2:  95%  Weight: 185 lb (83.9 kg)   Height: 6' (1.829 m)      Physical Exam Vitals and nursing note reviewed.  Constitutional:      General: He is not in acute distress.    Appearance: Normal appearance. He is normal weight. He is not ill-appearing, toxic-appearing or diaphoretic.  Cardiovascular:     Rate and Rhythm: Normal rate and regular rhythm.     Pulses: Normal pulses.     Heart sounds: Normal heart sounds. No murmur heard.  No friction rub. No gallop.   Pulmonary:     Effort: Pulmonary effort is normal. No respiratory distress.     Breath sounds: Normal breath sounds. No stridor. No  wheezing, rhonchi or rales.  Chest:     Chest wall: No tenderness.  Skin:    General: Skin is warm.     Findings: Erythema and rash present. Rash is macular.  Neurological:     Mental Status: He is alert and oriented to person, place, and time.     LABS:  No results found for this or any previous visit (from the past 24 hour(s)).   ASSESSMENT & PLAN:  1. Insect bite of left upper extremity, initial encounter   2. Erythematous rash     Meds ordered this encounter  Medications  . doxycycline (VIBRAMYCIN) 100 MG capsule    Sig: Take 1 capsule (100 mg total) by mouth 2 (two) times daily.    Dispense:  20 capsule    Refill:  0  . hydrocortisone 1 % lotion    Sig: Apply 1 application topically 2 (two) times daily.    Dispense:  118 mL    Refill:  0    Discharge instructions  Prescribed doxycycline and hydrocortisone Take as prescribed and to completion Follow up with PCP if symptoms persists Return or go to the ER if you have any new or worsening symptoms  Reviewed expectations re: course of current medical issues. Questions answered. Outlined signs and symptoms indicating need for more acute intervention. Patient verbalized understanding. After Visit Summary given.         Durward Parcel, FNP 01/02/20 1827

## 2020-01-02 NOTE — Discharge Instructions (Addendum)
Prescribed doxycycline and hydrocortisone Take as prescribed and to completion Follow up with PCP if symptoms persists Return or go to the ER if you have any new or worsening symptoms

## 2020-03-29 ENCOUNTER — Other Ambulatory Visit: Payer: BC Managed Care – PPO

## 2020-03-29 DIAGNOSIS — Z20822 Contact with and (suspected) exposure to covid-19: Secondary | ICD-10-CM

## 2020-04-01 LAB — NOVEL CORONAVIRUS, NAA: SARS-CoV-2, NAA: NOT DETECTED

## 2021-10-15 ENCOUNTER — Ambulatory Visit
Admission: RE | Admit: 2021-10-15 | Discharge: 2021-10-15 | Disposition: A | Discharge status: Home / Self Care | Payer: BC Managed Care – PPO | Source: Ambulatory Visit | Attending: Nurse Practitioner | Admitting: Nurse Practitioner

## 2021-10-15 VITALS — BP 152/89 | HR 71 | Temp 98.6°F | Resp 18

## 2021-10-15 DIAGNOSIS — J019 Acute sinusitis, unspecified: Secondary | ICD-10-CM

## 2021-10-15 MED ORDER — AMOXICILLIN-POT CLAVULANATE 875-125 MG PO TABS: 1.0000 | ORAL_TABLET | Freq: Two times a day (BID) | ORAL | 0 refills | Status: DC

## 2021-10-15 NOTE — ED Triage Notes (Signed)
Bilateral ear stuffiness x 1 week.  Also c/o headache.

## 2021-10-15 NOTE — Discharge Instructions (Addendum)
Take medication as directed. Continue your current allergy medication regimen. Increase fluids and get plenty of rest. May take over-the-counter ibuprofen or Tylenol as needed for pain, fever, or general discomfort. Recommend normal saline nasal spray to help with nasal congestion throughout the day. For your cough, it may be helpful to use a humidifier at bedtime during sleep. If your symptoms fail to improve within the next 7 to 10 days, please follow-up in our clinic.  

## 2021-10-15 NOTE — ED Provider Notes (Signed)
[facility]    CSN: [id number] Arrival date & time: 10/15/21  1518      History   Chief Complaint Chief Complaint  Patient presents with   Ear Fullness    Sinus infection - Entered by patient    HPI Chad Stephenson is a 46 y.o. male.   The history is provided by the patient.   Patient presents with 2-week history of headache, bilateral ear fullness, and nasal congestion.  Patient denies fever, chills, sore throat, cough, or GI symptoms.  Patient states that he usually gets a sinus infection annually.  He takes Claritin and Nasacort daily.  He states that his symptoms began worsening over the past several days.  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 Authorizing Provider  amoxicillin-clavulanate (AUGMENTIN) 875-125 MG tablet Take 1 tablet by mouth every 12 (twelve) hours. 10/15/21  Yes Caton Popowski-Warren, Sadie Haber, NP  atorvastatin (LIPITOR) 10 MG tablet Take 10 mg by mouth every evening. 01/09/18   [provider]  doxycycline (VIBRAMYCIN) 100 MG capsule Take 1 capsule (100 mg total) by mouth 2 (two) times daily. 01/02/20   Avegno, Zachery Dakins, FNP  hydrocortisone 1 % lotion Apply 1 application topically 2 (two) times daily. 01/02/20   Avegno, Zachery Dakins, FNP  imiquimod (ALDARA) 5 % cream Apply topically 2 (two) times a week. Patient not taking: Reported on 06/23/2018 01/04/18   Park Liter, DPM    Family History Family History  Problem Relation Age of  Onset   Colon cancer Neg Hx    Liver disease Neg Hx     Social History Social History   Tobacco Use   Smoking status: Never   Smokeless tobacco: Never  Substance Use Topics   Alcohol use: Yes    Comment: rare   Drug use: No     Allergies   Patient has no known allergies.   Review of Systems Review of Systems Per HPI  Physical Exam Triage Vital Signs ED Triage Vitals  Enc Vitals Group     BP 10/15/21 1528 (!) 152/89     Pulse Rate 10/15/21 1528 71     Resp 10/15/21 1528 18     Temp 10/15/21 1528 98.6 F (37 C)     Temp Source 10/15/21 1528 Oral     SpO2 10/15/21 1528 96 %     Weight --      Height --      Head Circumference --      Peak Flow --      Pain Score 10/15/21 1529 2     Pain Loc --      Pain Edu? --      Excl. in GC? --    No data found.  Updated Vital Signs BP (!) 152/89 (BP Location: Right Arm)   Pulse 71   Temp 98.6 F (37 C) (Oral)   Resp 18   SpO2  96%   Visual Acuity Right Eye Distance:   Left Eye Distance:   Bilateral Distance:    Right Eye Near:   Left Eye Near:    Bilateral Near:     Physical Exam Vitals and nursing note reviewed.  Constitutional:      General: He is not in acute distress.    Appearance: Normal appearance.  HENT:     Head: Normocephalic.     Right Ear: Ear canal and external ear normal. A middle ear effusion is present.     Left Ear: Ear canal and external ear normal. A middle ear effusion is present.     Nose: Congestion present. No rhinorrhea.     Right Turbinates: Enlarged and swollen.     Left Turbinates: Enlarged and swollen.     Right Sinus: No maxillary sinus tenderness or frontal sinus tenderness.     Left Sinus: No maxillary sinus tenderness or frontal sinus tenderness.     Mouth/Throat:     Lips: Pink.     Mouth: Mucous membranes are moist.     Pharynx: Oropharynx is clear. Uvula midline. No oropharyngeal exudate, posterior oropharyngeal erythema or uvula swelling.     Tonsils: No tonsillar  exudate.  Eyes:     Extraocular Movements: Extraocular movements intact.     Conjunctiva/sclera: Conjunctivae normal.     Pupils: Pupils are equal, round, and reactive to light.  Cardiovascular:     Rate and Rhythm: Normal rate and regular rhythm.     Pulses: Normal pulses.     Heart sounds: Normal heart sounds.  Pulmonary:     Effort: Pulmonary effort is normal.     Breath sounds: Normal breath sounds.  Abdominal:     General: Bowel sounds are normal.     Palpations: Abdomen is soft.  Musculoskeletal:     Cervical back: Normal range of motion.  Lymphadenopathy:     Cervical: No cervical adenopathy.  Skin:    General: Skin is warm and dry.  Neurological:     General: No focal deficit present.     Mental Status: He is alert and oriented to person, place, and time.  Psychiatric:        Mood and Affect: Mood normal.        Behavior: Behavior normal.      UC Treatments / Results  Labs (all labs ordered are listed, but only abnormal results are displayed) Labs Reviewed - No data to display  EKG   Radiology No results found.  Procedures Procedures (including critical care time)  Medications Ordered in UC Medications - No data to display  Initial Impression / Assessment and Plan / UC Course  I have reviewed the triage vital signs and the nursing notes.  Pertinent labs & imaging results that were available during my care of the patient were reviewed by me and considered in my medical decision making (see chart for details).   Patient presents with sinus symptoms that been present for the past 2 weeks.  Patient complains of headache, nasal congestion, and bilateral ear fullness.  On exam, patient has bilateral middle ear effusion with moderate swelling of the turbinates.  Based on the patient's duration of symptoms and his physical exam, will treat him with Augmentin.  Patient was advised to continue his current allergy regimen at this time.  Supportive care  recommendations were provided to the patient.  Patient advised to follow-up if symptoms fail to improve. Final Clinical Impressions(s) / UC Diagnoses  Final diagnoses:  Acute sinusitis, recurrence not specified, unspecified location     Discharge Instructions      Take medication as directed. Continue your current allergy medication regimen. Increase fluids and get plenty of rest. May take over-the-counter ibuprofen or Tylenol as needed for pain, fever, or general discomfort. Recommend normal saline nasal spray to help with nasal congestion throughout the day. For your cough, it may be helpful to use a humidifier at bedtime during sleep. If your symptoms fail to improve within the next 7 to 10 days, please follow-up in our clinic.      ED Prescriptions     Medication Sig Dispense Auth. Provider   amoxicillin-clavulanate (AUGMENTIN) 875-125 MG tablet Take 1 tablet by mouth every 12 (twelve) hours. 14 tablet Birdell Frasier-Warren, Sadie Haber, NP      PDMP not reviewed this encounter.   Abran Cantor, NP 10/15/21 [phone]

## 2022-07-28 ENCOUNTER — Ambulatory Visit
Admission: EM | Admit: 2022-07-28 | Discharge: 2022-07-28 | Disposition: A | Discharge status: Home / Self Care | Payer: BC Managed Care – PPO

## 2022-07-28 DIAGNOSIS — B9689 Other specified bacterial agents as the cause of diseases classified elsewhere: Secondary | ICD-10-CM | POA: Diagnosis not present

## 2022-07-28 DIAGNOSIS — J019 Acute sinusitis, unspecified: Secondary | ICD-10-CM | POA: Diagnosis not present

## 2022-07-28 MED ORDER — AMOXICILLIN-POT CLAVULANATE 875-125 MG PO TABS: 1.0000 | ORAL_TABLET | Freq: Two times a day (BID) | ORAL | 0 refills | Status: AC

## 2022-07-28 NOTE — Discharge Instructions (Addendum)
You have a bacterial sinus infection.  Take the Augmentin as prescribed to treat it.  Symptoms should improve over the next week to 10 days.  If you develop chest pain or shortness of breath, go to the emergency room.  Some things that can make you feel better are: - Increased rest - Increasing fluid with water/sugar free electrolytes - Acetaminophen and ibuprofen as needed for fever/pain - Salt water gargling, chloraseptic spray and throat lozenges for sore throat - OTC guaifenesin (Mucinex) 600 mg twice daily for congestion - Saline sinus flushes or a neti pot - Humidifying the air

## 2022-07-28 NOTE — ED Triage Notes (Addendum)
Pt c/o sinus issues pt states he has had sinus drainage for about  1 week towards the end of the week he lost his voice he started feeling better over the weekend and woke up this morning feeling worse than he has

## 2022-07-28 NOTE — ED Provider Notes (Signed)
[facility]    CSN: [id number] Arrival date & time: 07/28/22  1709      History   Chief Complaint No chief complaint on file.   HPI Chad Stephenson is a 47 y.o. male.   Patient presents today for 1 week history of congested cough, shortness of breath and chest pain after coughing, stuffy nose, sore throat, sinus pressure in his cheeks and above his eyes, headache, decreased appetite, and fatigue.  Reports symptoms were improving towards the beginning of the week and, then worsened over the weekend and into today.  He endorses body aches today, however no known fevers.  Has been taking Claritin and NyQuil for symptoms with minimal improvement.  Patient denies antibiotic use in the past 90 days.    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 Authorizing Provider  rosuvastatin (CRESTOR) 10 MG tablet Take 10 mg by mouth 2 (two) times a week. 07/17/22  Yes [provider]  amoxicillin-clavulanate (AUGMENTIN) 875-125 MG tablet Take 1 tablet by mouth every 12 (twelve) hours. 07/28/22   Valentino Nose, NP    Family History Family History  Problem Relation Age of Onset   Colon cancer Neg Hx    Liver disease Neg Hx     Social History Social History   Tobacco Use   Smoking status: Never   Smokeless tobacco: Never  Substance Use Topics   Alcohol use: Yes    Comment: rare   Drug use: No     Allergies   Patient has no known allergies.   Review of  Systems Review of Systems Per HPI  Physical Exam Triage Vital Signs ED Triage Vitals [07/28/22 1806]  Enc Vitals Group     BP (!) 155/97     Pulse Rate 97     Resp 15     Temp 98.9 F (37.2 C)     Temp Source Oral     SpO2 98 %     Weight      Height      Head Circumference      Peak Flow      Pain Score 0     Pain Loc      Pain Edu?      Excl. in GC?    No data found.  Updated Vital Signs BP (!) 155/97 (BP Location: Right Arm)   Pulse 97   Temp 98.9 F (37.2 C) (Oral)   Resp 15   SpO2 98%   Visual Acuity Right Eye Distance:   Left Eye Distance:   Bilateral Distance:    Right Eye Near:   Left Eye Near:    Bilateral Near:     Physical Exam Vitals and nursing note reviewed.  Constitutional:      General: He is not in acute distress.    Appearance: Normal appearance. He is not ill-appearing or toxic-appearing.  HENT:  Head: Normocephalic and atraumatic.     Right Ear: Tympanic membrane, ear canal and external ear normal.     Left Ear: Tympanic membrane, ear canal and external ear normal.     Nose: Congestion and rhinorrhea present.     Right Sinus: Maxillary sinus tenderness and frontal sinus tenderness present.     Left Sinus: Maxillary sinus tenderness and frontal sinus tenderness present.     Mouth/Throat:     Mouth: Mucous membranes are moist.     Pharynx: Oropharynx is clear. No oropharyngeal exudate or posterior oropharyngeal erythema.  Eyes:     General: No scleral icterus.    Extraocular Movements: Extraocular movements intact.  Cardiovascular:     Rate and Rhythm: Normal rate and regular rhythm.  Pulmonary:     Effort: Pulmonary effort is normal. No respiratory distress.     Breath sounds: Normal breath sounds. No wheezing, rhonchi or rales.  Musculoskeletal:     Cervical back: Normal range of motion and neck supple.  Lymphadenopathy:     Cervical: Cervical adenopathy present.  Skin:    General: Skin is warm and dry.     Coloration:  Skin is not jaundiced or pale.     Findings: No erythema or rash.  Neurological:     Mental Status: He is alert and oriented to person, place, and time.  Psychiatric:        Behavior: Behavior is cooperative.      UC Treatments / Results  Labs (all labs ordered are listed, but only abnormal results are displayed) Labs Reviewed - No data to display  EKG   Radiology No results found.  Procedures Procedures (including critical care time)  Medications Ordered in UC Medications - No data to display  Initial Impression / Assessment and Plan / UC Course  I have reviewed the triage vital signs and the nursing notes.  Pertinent labs & imaging results that were available during my care of the patient were reviewed by me and considered in my medical decision making (see chart for details).   Patient is well-appearing, afebrile, not tachycardic, not tachypneic, oxygenating well on room air.  Patient is mildly hypertensive today in urgent care.  1. Acute bacterial sinusitis Treat with Augmentin twice daily for 7 days Supportive care discussed with patient ER and return precautions discussed  The patient was given the opportunity to ask questions.  All questions answered to their satisfaction.  The patient is in agreement to this plan.    Final Clinical Impressions(s) / UC Diagnoses   Final diagnoses:  Acute bacterial sinusitis     Discharge Instructions      You have a bacterial sinus infection.  Take the Augmentin as prescribed to treat it.  Symptoms should improve over the next week to 10 days.  If you develop chest pain or shortness of breath, go to the emergency room.  Some things that can make you feel better are: - Increased rest - Increasing fluid with water/sugar free electrolytes - Acetaminophen and ibuprofen as needed for fever/pain - Salt water gargling, chloraseptic spray and throat lozenges for sore throat - OTC guaifenesin (Mucinex) 600 mg twice daily for  congestion - Saline sinus flushes or a neti pot - Humidifying the air     ED Prescriptions     Medication Sig Dispense Auth. Provider   amoxicillin-clavulanate (AUGMENTIN) 875-125 MG tablet Take 1 tablet by mouth every 12 (twelve) hours. 14 tablet Valentino Nose, NP  PDMP not reviewed this encounter.   Valentino Nose, NP 07/28/22 [phone]

## 2023-11-11 ENCOUNTER — Ambulatory Visit (INDEPENDENT_AMBULATORY_CARE_PROVIDER_SITE_OTHER): Admitting: Surgical

## 2023-11-11 ENCOUNTER — Encounter: Payer: Self-pay | Admitting: Surgical

## 2023-11-11 ENCOUNTER — Other Ambulatory Visit: Payer: Self-pay

## 2023-11-11 DIAGNOSIS — M5442 Lumbago with sciatica, left side: Secondary | ICD-10-CM

## 2023-11-11 DIAGNOSIS — G8929 Other chronic pain: Secondary | ICD-10-CM

## 2023-11-11 MED ORDER — METHOCARBAMOL 500 MG PO TABS: 500.0000 mg | ORAL_TABLET | Freq: Three times a day (TID) | ORAL | 1 refills | Status: AC | PRN

## 2023-11-11 NOTE — Progress Notes (Signed)
 Office Visit Note   Patient: Chad Stephenson           Date of Birth: 06-27-1975           MRN: [id number] Visit Date: 11/11/2023 Requested by: Rosamond Leta NOVAK, MD [address] [place],  [place] [address] PCP: Rosamond Leta NOVAK, MD  Subjective: No chief complaint on file.   HPI: Chad Stephenson is a 48 y.o. male who presents to the office reporting numbness of the left foot along with back pain.  Has had history of prior lumbar spine surgery with Dr. 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  He had developing foot drop at this time and required surgery.  He has had some chronic numbness of the lateral aspect of his foot and over the past few months has noticed increased numbness spreading to the great toe of the left foot.  He denies any radicular leg pain or any difficulty with putting pressure on his left buttock like he had prior to his spine surgery about a decade ago.  Recently he lifted a portable AC unit and after that, he had increased leg weakness and feeling like his ankle just gave way underneath him which is a new problem for him.  Patient states that he overall feels a lot better in the last week.  He denies any buttock pain which was a major problem for him prior to his last back surgery.  He denies any new low back pain.  His main concern is the ankle instability which was a problem prior to his last back surgery.  Denies any radicular pain down the leg.  Denies any red flag symptoms such as bowel/bladder incontinence or saddle anesthesia..                ROS: All systems reviewed are negative as they relate to the chief complaint within the history of present illness.  Patient denies fevers or chills.  Assessment & Plan: Visit Diagnoses:  1. Chronic low back pain with sciatica, sciatica laterality unspecified, unspecified back pain laterality     Plan: Impression is 48 year old male who presents for evaluation of  chronic numbness in the left foot that has been somewhat worse over the past few months.  In the last week or 2, numbness has been better but he is concerned about ankle weakness and instability which was a problem prior to his spine surgery with Dr. Gaither about a decade ago.  On exam, he does have what is likely to be some chronic weakness of peroneal musculature and ankle eversion.  We discussed options available to patient.  With no increase in his typical low back pain or any radicular symptoms, plan to try 1 session of physical therapy to design home exercise program for ankle strengthening program.  Prescribed muscle relaxers.  If no improvement or if he has progressive symptoms, next up would be MRI of the lumbar spine versus nerve conduction study of the left lower extremity.  Follow-Up Instructions: No follow-ups on file.   Orders:  No orders of the defined types were placed in this encounter.  No orders of the defined types were placed in this encounter.     Procedures: No procedures performed   Clinical Data: No additional findings.  Objective: Vital Signs: There were no vitals taken for this visit.  Physical Exam:  Constitutional: Patient appears well-developed HEENT:  Head:  Normocephalic Eyes:EOM are normal Neck: Normal range of motion Cardiovascular: Normal rate Pulmonary/chest: Effort normal Neurologic: Patient is alert Skin: Skin is warm Psychiatric: Patient has normal mood and affect  Ortho Exam: Ortho exam demonstrates intact hip flexion, quadricep, hamstring, dorsiflexion, plantarflexion strength rated 5/5 bilaterally.  Intact EHL bilaterally.  He does have some asymmetric weakness of ankle eversion of the left foot rated 4/5 relative to 5/5 in the right foot.  5/5 strength of ankle inversion strength.  No deformity or swelling noted to the left ankle.  Minimal atrophy noted of the peroneal muscle region of the left leg relative to the right.  Negative straight leg  raise bilaterally.  No clonus noted bilaterally.  No pain with hip range of motion bilaterally.  Specialty Comments:  No specialty comments available.  Imaging: No results found.   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

## 2023-11-22 ENCOUNTER — Encounter: Payer: Self-pay | Admitting: Surgical

## 2023-12-08 ENCOUNTER — Ambulatory Visit: Admitting: Physical Therapy

## 2024-01-18 ENCOUNTER — Encounter: Payer: Self-pay | Admitting: Radiology

## 2024-03-16 ENCOUNTER — Ambulatory Visit: Payer: Self-pay
